# Patient Record
Sex: Female | Born: 1955 | Race: White | Hispanic: No | Marital: Single | Smoking: Never smoker
Health system: Southern US, Community
[De-identification: ages and names within clinical notes are randomized; demographics above are authoritative.]

## PROBLEM LIST (undated history)

## (undated) DIAGNOSIS — K219 Gastro-esophageal reflux disease without esophagitis: Secondary | ICD-10-CM

## (undated) HISTORY — PX: HERNIA REPAIR: SHX51

## (undated) HISTORY — DX: Gastro-esophageal reflux disease without esophagitis: K21.9

## (undated) HISTORY — PX: CHOLECYSTECTOMY: SHX55

## (undated) HISTORY — PX: COLONOSCOPY: SHX5424

---

## 1991-10-11 HISTORY — PX: ABDOMINAL HYSTERECTOMY: SHX81

## 1999-01-18 ENCOUNTER — Other Ambulatory Visit: Admission: RE | Admit: 1999-01-18 | Discharge: 1999-01-18 | Payer: Self-pay | Admitting: Obstetrics and Gynecology

## 2000-02-21 ENCOUNTER — Other Ambulatory Visit: Admission: RE | Admit: 2000-02-21 | Discharge: 2000-02-21 | Payer: Self-pay | Admitting: *Deleted

## 2001-02-21 ENCOUNTER — Other Ambulatory Visit: Admission: RE | Admit: 2001-02-21 | Discharge: 2001-02-21 | Payer: Self-pay | Admitting: *Deleted

## 2002-03-12 ENCOUNTER — Other Ambulatory Visit: Admission: RE | Admit: 2002-03-12 | Discharge: 2002-03-12 | Payer: Self-pay | Admitting: Obstetrics and Gynecology

## 2003-03-18 ENCOUNTER — Other Ambulatory Visit: Admission: RE | Admit: 2003-03-18 | Discharge: 2003-03-18 | Payer: Self-pay | Admitting: Obstetrics and Gynecology

## 2004-10-10 HISTORY — PX: KNEE ARTHROSCOPY: SHX127

## 2009-10-10 HISTORY — PX: TOTAL KNEE ARTHROPLASTY: SHX125

## 2010-02-26 ENCOUNTER — Inpatient Hospital Stay (HOSPITAL_COMMUNITY): Admission: RE | Admit: 2010-02-26 | Discharge: 2010-03-01 | Payer: Self-pay | Admitting: Specialist

## 2010-03-03 ENCOUNTER — Ambulatory Visit: Admission: RE | Admit: 2010-03-03 | Discharge: 2010-03-03 | Payer: Self-pay | Admitting: Specialist

## 2010-03-03 ENCOUNTER — Encounter (INDEPENDENT_AMBULATORY_CARE_PROVIDER_SITE_OTHER): Payer: Self-pay | Admitting: Specialist

## 2010-03-03 ENCOUNTER — Ambulatory Visit: Payer: Self-pay | Admitting: Vascular Surgery

## 2010-10-10 HISTORY — PX: CHOLECYSTECTOMY: SHX55

## 2010-10-10 HISTORY — PX: KNEE ARTHROSCOPY: SHX127

## 2010-12-27 LAB — BASIC METABOLIC PANEL
BUN: 6 mg/dL (ref 6–23)
BUN: 7 mg/dL (ref 6–23)
CO2: 24 mEq/L (ref 19–32)
Chloride: 104 mEq/L (ref 96–112)
Chloride: 110 mEq/L (ref 96–112)
Creatinine, Ser: 0.84 mg/dL (ref 0.4–1.2)
GFR calc Af Amer: >60 mL/min (ref >60)
GFR calc non Af Amer: >60 mL/min (ref >60)
Potassium: 3.8 mEq/L (ref 3.5–5.1)
Potassium: 4 mEq/L (ref 3.5–5.1)
Sodium: 135 mEq/L (ref 135–145)

## 2010-12-27 LAB — CBC
HCT: 26.5 % — ABNORMAL LOW (ref 36.0–46.0)
HCT: 28.5 % — ABNORMAL LOW (ref 36.0–46.0)
HCT: 31.8 % — ABNORMAL LOW (ref 36.0–46.0)
Hemoglobin: 10.6 g/dL — ABNORMAL LOW (ref 12.0–15.0)
MCHC: 32.1 g/dL (ref 30.0–36.0)
MCV: 87.4 fL (ref 78.0–100.0)
MCV: 87.5 fL (ref 78.0–100.0)
MCV: 88.8 fL (ref 78.0–100.0)
Platelets: 176 10*3/uL (ref 150–400)
Platelets: 184 10*3/uL (ref 150–400)
Platelets: 201 10*3/uL (ref 150–400)
RBC: 3.63 MIL/uL — ABNORMAL LOW (ref 3.87–5.11)
RDW: 16.1 % — ABNORMAL HIGH (ref 11.5–15.5)
WBC: 13.4 10*3/uL — ABNORMAL HIGH (ref 4.0–10.5)
WBC: 13.4 10*3/uL — ABNORMAL HIGH (ref 4.0–10.5)
WBC: 18.8 10*3/uL — ABNORMAL HIGH (ref 4.0–10.5)

## 2010-12-27 LAB — ABO/RH: ABO/RH(D): A NEG

## 2010-12-27 LAB — CROSSMATCH

## 2010-12-28 LAB — DIFFERENTIAL
Eosinophils Absolute: 0.3 10*3/uL (ref 0.0–0.7)
Eosinophils Relative: 4 % (ref 0–5)
Lymphs Abs: 1.8 10*3/uL (ref 0.7–4.0)
Monocytes Absolute: 0.6 10*3/uL (ref 0.1–1.0)
Monocytes Relative: 8 % (ref 3–12)
Neutrophils Relative %: 64 % (ref 43–77)

## 2010-12-28 LAB — URINALYSIS, ROUTINE W REFLEX MICROSCOPIC
Bilirubin Urine: NEGATIVE
Ketones, ur: NEGATIVE mg/dL
Nitrite: NEGATIVE
Specific Gravity, Urine: 1.009 (ref 1.005–1.030)
Urobilinogen, UA: 0.2 mg/dL (ref 0.0–1.0)

## 2010-12-28 LAB — COMPREHENSIVE METABOLIC PANEL
ALT: 19 U/L (ref 0–35)
AST: 25 U/L (ref 0–37)
Albumin: 3.7 g/dL (ref 3.5–5.2)
Calcium: 9.2 mg/dL (ref 8.4–10.5)
GFR calc Af Amer: >60 mL/min (ref >60)
Potassium: 4.1 mEq/L (ref 3.5–5.1)
Sodium: 139 mEq/L (ref 135–145)
Total Protein: 7 g/dL (ref 6.0–8.3)

## 2010-12-28 LAB — PROTIME-INR: INR: 1.02 (ref 0.00–1.49)

## 2010-12-28 LAB — CBC
MCHC: 32.7 g/dL (ref 30.0–36.0)
RDW: 15.7 % — ABNORMAL HIGH (ref 11.5–15.5)

## 2014-07-04 ENCOUNTER — Ambulatory Visit (INDEPENDENT_AMBULATORY_CARE_PROVIDER_SITE_OTHER): Payer: BC Managed Care – PPO | Admitting: Family Medicine

## 2014-07-04 ENCOUNTER — Encounter: Payer: Self-pay | Admitting: Family Medicine

## 2014-07-04 VITALS — BP 141/81 | HR 79 | Ht 64.75 in | Wt 192.0 lb

## 2014-07-04 DIAGNOSIS — M795 Residual foreign body in soft tissue: Secondary | ICD-10-CM | POA: Diagnosis not present

## 2014-07-04 DIAGNOSIS — K429 Umbilical hernia without obstruction or gangrene: Secondary | ICD-10-CM | POA: Diagnosis not present

## 2014-07-04 NOTE — Progress Notes (Signed)
CC: Amanda Walters is a 58 y.o. 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 Symptoms are described as stabbing, "13" on a 0-10 pain scale, and it comes and uncomfortable waves. Slightly improved when holding the abdomen. Nothing else seems to make better or worse. She had a CT scan done in March that revealed an umbilical hernia, she tells me that she was seen by a [facility] and they decided to watch and wait to see if pain got any worse. Patient believes that it is truly getting worse on a monthly basis. She had an attack this morning that was the worst she's ever experienced. The episode lasted a little over an hour.    Interestingly the CT scan she has showed a fragment of a needle in her left buttock and over the past few months she's noticed that she's having a sharp pain in that left buttock whenever sitting for long periods of time.   Review of Systems - General ROS: negative for - chills, fever, night sweats, weight gain or weight loss Ophthalmic ROS: negative for - decreased vision Psychological ROS: negative for - anxiety or depression ENT ROS: negative for - hearing change, nasal congestion, tinnitus or allergies Hematological and Lymphatic ROS: negative for - bleeding problems, bruising or swollen lymph nodes Breast ROS: negative Respiratory ROS: no cough, shortness of breath, or wheezing Cardiovascular ROS: no chest pain or dyspnea on exertion Gastrointestinal ROS: no change in bowel habits, or black or bloody stools Genito-Urinary ROS: negative for - genital discharge, genital ulcers, incontinence or abnormal bleeding from genitals Musculoskeletal ROS: negative for - joint pain or muscle pain other than that  described above Neurological ROS: negative for - headaches or memory loss Dermatological ROS: negative for lumps, mole changes, rash and skin lesion changes  History reviewed. No pertinent past medical history.  Past Surgical History  Procedure Laterality Date  . Abdominal hysterectomy  1993  . Knee arthroscopy  2006  . Total knee arthroplasty  2011  . Knee arthroscopy  2012   Family History  Problem Relation Age of Onset  . Heart attack      mother     History   Social History  . Marital Status: Single    Spouse Name: N/A    Number of Children: N/A  . Years of Education: N/A   Occupational History  . Not on file.   Social History Main Topics  . Smoking status: Never Smoker   . Smokeless tobacco: Not on file  . Alcohol Use: Not on file  . Drug Use: Not on file  . Sexual Activity: Not Currently   Other Topics Concern  . Not on file   Social History Narrative  . No narrative on file     Objective: BP 141/81  Pulse 79  Ht 5' 4.75" (1.645 m)  Wt 192 lb (87.091 kg)  BMI 32.18 kg/m2  General: Alert and Oriented, No Acute Distress HEENT: Pupils equal, round, reactive to light. Conjunctivae clear.   moist membranes pharynx unremarkable  Lungs:  clear and comfortable work of breathing  Cardiac: Regular rate and rhythm.  Abdomen: Normal bowel sounds,  soft with an approximately one and a half abdominal wall defect at the umbilicus with a hernia  that is easily reducible. No other palpable masses .no rebound guarding or rigidity  Extremities: No peripheral edema.  Strong peripheral pulses.  Mental Status: No depression, anxiety, nor agitation. Skin: Warm and dry.  Assessment & Plan: Amanda Walters was seen today for establish care and concerned about hernia.  Diagnoses and associated orders for this visit:  Umbilical hernia without obstruction and without gangrene - Ambulatory referral to General Surgery  Foreign body (FB) in soft tissue    Umbilical hernia: Given  the degree of her pain I think is quite reasonable to have her see Gen. surgery for consideration of hernia repair.  It sounds like the original surgery group did not get a good grasp of how much pain she was in. I've asked her to mention to them about the foreign body in her right buttock because their office may be able to help manage this as well.Signs and symptoms requring emergent/urgent reevaluation were discussed with the patient.   Return if symptoms worsen or fail to improve.

## 2014-07-21 ENCOUNTER — Telehealth: Payer: Self-pay | Admitting: Family Medicine

## 2014-07-21 DIAGNOSIS — K429 Umbilical hernia without obstruction or gangrene: Secondary | ICD-10-CM | POA: Insufficient documentation

## 2014-07-21 NOTE — Telephone Encounter (Signed)
Opened for care everywhere 

## 2014-08-29 ENCOUNTER — Ambulatory Visit (INDEPENDENT_AMBULATORY_CARE_PROVIDER_SITE_OTHER): Payer: BC Managed Care – PPO | Admitting: Family Medicine

## 2014-08-29 ENCOUNTER — Encounter: Payer: Self-pay | Admitting: Family Medicine

## 2014-08-29 VITALS — BP 135/84 | HR 73 | Wt 187.0 lb

## 2014-08-29 DIAGNOSIS — S134XXA Sprain of ligaments of cervical spine, initial encounter: Secondary | ICD-10-CM | POA: Diagnosis not present
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
## 2016-03-16 ENCOUNTER — Telehealth: Payer: Self-pay

## 2016-03-16 DIAGNOSIS — M79642 Pain in left hand: Principal | ICD-10-CM

## 2016-03-16 DIAGNOSIS — M79641 Pain in right hand: Secondary | ICD-10-CM

## 2016-03-16 NOTE — Telephone Encounter (Signed)
Pt.notified

## 2016-03-16 NOTE — Telephone Encounter (Signed)
Pt is requesting a referral to a rheumatologist.  She is c/o bilateral hand pain starting at the thumbs here recently traveling to the other fingers. If you agree referral is half way placed I'm not sure where to sent it. Please advise.

## 2016-03-16 NOTE — Telephone Encounter (Signed)
Referral sounds appropriate, order has been placed.

## 2016-07-07 ENCOUNTER — Encounter: Payer: Self-pay | Admitting: Osteopathic Medicine

## 2016-07-07 ENCOUNTER — Ambulatory Visit (INDEPENDENT_AMBULATORY_CARE_PROVIDER_SITE_OTHER): Payer: BC Managed Care – PPO | Admitting: Osteopathic Medicine

## 2016-07-07 VITALS — BP 124/52 | HR 76 | Temp 98.9°F | Ht 64.0 in | Wt 193.0 lb

## 2016-07-07 DIAGNOSIS — J069 Acute upper respiratory infection, unspecified: Secondary | ICD-10-CM | POA: Diagnosis not present

## 2016-07-07 DIAGNOSIS — J329 Chronic sinusitis, unspecified: Secondary | ICD-10-CM | POA: Diagnosis not present

## 2016-07-07 DIAGNOSIS — B9789 Other viral agents as the cause of diseases classified elsewhere: Principal | ICD-10-CM

## 2016-07-07 MED ORDER — AMOXICILLIN-POT CLAVULANATE 875-125 MG PO TABS: 1.0000 | ORAL_TABLET | Freq: Two times a day (BID) | ORAL | 0 refills | Status: DC

## 2016-07-07 MED ORDER — BENZONATATE 200 MG PO CAPS: 200.0000 mg | ORAL_CAPSULE | Freq: Three times a day (TID) | ORAL | 0 refills | Status: DC | PRN

## 2016-07-07 MED ORDER — IPRATROPIUM BROMIDE 0.03 % NA SOLN: 2.0000 | Freq: Three times a day (TID) | NASAL | 0 refills | Status: DC | PRN

## 2016-07-07 NOTE — Patient Instructions (Signed)
Most likely your symptoms are due to viral upper respiratory illness causing postnasal drip, sore throat, cough, and sinus congestion. The common cold or viral illness is not something that we can cure, but we can help control symptoms while your body fights the infection.   You've been given prescription for nasal spray to help with sinus drainage, plus a prescription for cough medicine. Other over-the-counter remedies which are typically helpful include taking all of the following together: Ibuprofen, Tylenol, decongestants such as Sudafed, antihistamine such as Benadryl. Use caution, many generics are sold in cold/flu formulations as combination, always ask a pharmacist if you are concerned about any medication interactions or duplications. Look for lozenges which contain menthol plus benzocaine, these will help numb the throat and prevent cough. Be sure you're staying well hydrated with plenty of water or warm tea with honey.   Most people feel better from a viral upper respiratory infection in 7-10 days, though cough symptoms can linger for several weeks. Please let us know if you're not getting better or if you get worse.

## 2016-07-07 NOTE — Progress Notes (Signed)
HPI: Amanda Walters is a 60 y.o. female  who presents to [place] today, 07/07/16,  for chief complaint of:  Chief Complaint  Patient presents with  . Establish Care    Switching from [place]. cough     . Quality: dry cough  . Duration: 6 days . Modifying factors: Mucinex, no oher OTC medication. Hard candies.  . Assoc signs/symptoms: sinus congestion, sore throat, ears pop, subjective fever few days ago     Past medical, surgical, social and family history reviewed: History reviewed. No pertinent past medical history. Past Surgical History:  Procedure Laterality Date  . ABDOMINAL HYSTERECTOMY  1993  . KNEE ARTHROSCOPY  2006  . KNEE ARTHROSCOPY  2012  . TOTAL KNEE ARTHROPLASTY  2011   Social History  Substance Use Topics  . Smoking status: Never Smoker  . Smokeless tobacco: Never Used  . Alcohol use Not on file   Family History  Problem Relation Age of Onset  . Heart attack      mother      Current medication list and allergy/intolerance information reviewed:   Current Outpatient Prescriptions  Medication Sig Dispense Refill  . Cholecalciferol (D3 ADULT PO) Take 1,000 mg by mouth.    . Cyanocobalamin (B-12 PO) Take by mouth.    . estradiol (ESTRACE) 1 MG tablet     . Multiple Vitamins-Minerals (MULTIVITAMIN PO) Take by mouth.    Marland Kitchen omeprazole (PRILOSEC) 20 MG capsule Take 20 mg by mouth daily.     No current facility-administered medications for this visit.    Allergies  Allergen Reactions  . Morphine And Related Itching  . Cephalexin Other (See Comments)    "funny feeling"  . Sulfa Antibiotics Hives      Review of Systems:  Constitutional:  +fever, no chills, +recent illness, No unintentional weight changes.   HEENT: No  headache, no vision change, no hearing change, +sore throat, +sinus pressure  Cardiac: No  chest pain  Respiratory:  No  shortness of breath. +Cough  Gastrointestinal: No  abdominal pain, No   nausea   Musculoskeletal: No new myalgia/arthralgia  Skin: No  Rash,   Neurologic: No  weakness, No  dizziness,   Psychiatric: No  concerns with depression, No  concerns with anxiety, No sleep problems, No mood problems  Exam:  BP (!) 124/52   Pulse 76   Temp 98.9 F (37.2 C) (Oral)   Ht 5\' 4"  (1.626 m)   Wt 193 lb (87.5 kg)   BMI 33.13 kg/m   Constitutional: VS see above. General Appearance: alert, well-developed, well-nourished, NAD  Eyes: Normal lids and conjunctive, non-icteric sclera  Ears, Nose, Mouth, Throat: MMM, Normal external inspection ears/nares/mouth/lips/gums. TM normal bilaterally. Pharynx/tonsils no erythema, no exudate. Nasal mucosa normal.   Neck: No masses, trachea midline. No thyroid enlargement. No tenderness/mass appreciated. No lymphadenopathy  Respiratory: Normal respiratory effort. no wheeze, no rhonchi, no rales  Cardiovascular: S1/S2 normal, no murmur, no rub/gallop auscultated. RRR.   Psychiatric: Normal judgment/insight. Normal mood and affect. Oriented x3.    No results found for this or any previous visit (from the past 72 hour(s)).  No results found.   ASSESSMENT/PLAN:   Viral URI with cough - Plan: ipratropium (ATROVENT) 0.03 % nasal spray, benzonatate (TESSALON) 200 MG capsule  Rhinosinusitis - Plan: amoxicillin-clavulanate (AUGMENTIN) 875-125 MG tablet instructions to fill antibiotic if no improvement in addition days after initial onset of symptoms.   Patient Instructions  Most likely your symptoms  are due to viral upper respiratory illness causing postnasal drip, sore throat, cough, and sinus congestion. The common cold or viral illness is not something that we can cure, but we can help control symptoms while your body fights the infection.   You've been given prescription for nasal spray to help with sinus drainage, plus a prescription for cough medicine. Other over-the-counter remedies which are typically helpful include taking  all of the following together: Ibuprofen, Tylenol, decongestants such as Sudafed, antihistamine such as Benadryl. Use caution, many generics are sold in cold/flu formulations as combination, always ask a pharmacist if you are concerned about any medication interactions or duplications. Look for lozenges which contain menthol plus benzocaine, these will help numb the throat and prevent cough. Be sure you're staying well hydrated with plenty of water or warm tea with honey.   Most people feel better from a viral upper respiratory infection in 7-10 days, though cough symptoms can linger for several weeks. Please let us know if you're not getting better or if you get worse.     Visit summary with medication list and pertinent instructions was printed for patient to review. All questions at time of visit were answered - patient instructed to contact office with any additional concerns. ER/RTC precautions were reviewed with the patient. Follow-up plan: Return if symptoms worsen or fail to improve.

## 2016-07-19 ENCOUNTER — Encounter: Payer: Self-pay | Admitting: Sports Medicine

## 2016-07-19 ENCOUNTER — Ambulatory Visit (INDEPENDENT_AMBULATORY_CARE_PROVIDER_SITE_OTHER): Payer: BC Managed Care – PPO | Admitting: Sports Medicine

## 2016-07-19 DIAGNOSIS — B349 Viral infection, unspecified: Secondary | ICD-10-CM

## 2016-07-19 DIAGNOSIS — B9789 Other viral agents as the cause of diseases classified elsewhere: Secondary | ICD-10-CM | POA: Diagnosis not present

## 2016-07-19 DIAGNOSIS — J069 Acute upper respiratory infection, unspecified: Secondary | ICD-10-CM | POA: Diagnosis not present

## 2016-07-19 MED ORDER — MELOXICAM 15 MG PO TABS: ORAL_TABLET | ORAL | 3 refills | Status: DC

## 2016-07-19 MED ORDER — BENZONATATE 200 MG PO CAPS: 200.0000 mg | ORAL_CAPSULE | Freq: Three times a day (TID) | ORAL | 0 refills | Status: DC | PRN

## 2016-07-19 MED ORDER — DIPHENOXYLATE-ATROPINE 2.5-0.025 MG PO TABS: ORAL_TABLET | ORAL | 3 refills | Status: DC

## 2016-07-19 NOTE — Assessment & Plan Note (Signed)
Has already finished a course of Augmentin for sinus-type symptoms, also having some diarrhea, malaise. Suspect persistence of her viral type illness, exam is benign. Worst symptoms are diarrhea and malaise so we will add meloxicam and Lomotil, out of work for one week.

## 2016-07-19 NOTE — Progress Notes (Signed)
  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 These have improved but she continues to simply have a low-grade cough, diarrhea several times per day without blood, and malaise. Symptoms are mild, improving.  Past medical history:  Negative.  See flowsheet/record as well for more information.  Surgical history: Negative.  See flowsheet/record as well for more information.  Family history: Negative.  See flowsheet/record as well for more information.  Social history: Negative.  See flowsheet/record as well for more information.  Allergies, and medications have been entered into the medical record, reviewed, and no changes needed.   Review of Systems: No fevers, chills, night sweats, weight loss, chest pain, or shortness of breath.   Objective:    General: Well Developed, well nourished, and in no acute distress.  Neuro: Alert and oriented x3, extra-ocular muscles intact, sensation grossly intact.  HEENT: Normocephalic, atraumatic, pupils equal round reactive to light, neck supple, no masses, no lymphadenopathy, thyroid nonpalpable. Oropharynx, nasopharynx, ear canals unremarkable. Skin: Warm and dry, no rashes. Cardiac: Regular rate and rhythm, no murmurs rubs or gallops, no lower extremity edema.  Respiratory: Clear to auscultation bilaterally. Not using accessory muscles, speaking in full sentences. Abdomen: Soft, nontender, nondistended, normal bowel sounds, no palpable masses, guarding, rigidity, or rebound tenderness.  Impression and Recommendations:    Viral syndrome Has already finished a course of Augmentin for sinus-type symptoms, also having some diarrhea, malaise. Suspect persistence of her viral type illness, exam is benign. Worst symptoms are diarrhea and malaise so we will add meloxicam  and Lomotil, out of work for one week.  I spent 25 minutes with this patient, greater than 50% was face-to-face time counseling regarding the above diagnoses

## 2016-07-25 ENCOUNTER — Telehealth: Payer: Self-pay

## 2016-07-25 DIAGNOSIS — R197 Diarrhea, unspecified: Secondary | ICD-10-CM

## 2016-07-25 NOTE — Telephone Encounter (Signed)
Orders placed for stool culture, C. difficile, ova and parasites. Letter in my box.

## 2016-07-25 NOTE — Telephone Encounter (Signed)
Amanda Walters still has diarrhea. She has diarrhea a least 3 times daily. She would like to have a stool culture and C-diff checked.   She also would like to have a work note for another day out of work.

## 2016-07-25 NOTE — Telephone Encounter (Signed)
Patient has picked up orders and letter.

## 2016-07-26 ENCOUNTER — Telehealth: Payer: Self-pay

## 2016-07-26 NOTE — Telephone Encounter (Signed)
Patient advised of recommendations.  

## 2016-07-26 NOTE — Telephone Encounter (Signed)
Amanda Walters called and states she still has diarrhea. She is concerned she is contagious and is worried about returning to school. Please advise. She did drop off the stool specimen.

## 2016-07-26 NOTE — Telephone Encounter (Signed)
Symptoms can persist for 2 weeks or longer, we will need to certainly await results of the stool cultures. Penicillin-containing antibiotics can also create some degree of prolonged diarrhea.

## 2016-07-27 LAB — C. DIFFICILE GDH AND TOXIN A/B
C. difficile GDH: NOT DETECTED
C. difficile Toxin A/B: NOT DETECTED

## 2016-07-28 ENCOUNTER — Encounter: Payer: Self-pay | Admitting: Sports Medicine

## 2016-07-30 LAB — STOOL CULTURE

## 2016-08-15 ENCOUNTER — Other Ambulatory Visit: Payer: Self-pay | Admitting: Osteopathic Medicine

## 2016-08-15 DIAGNOSIS — B9789 Other viral agents as the cause of diseases classified elsewhere: Principal | ICD-10-CM

## 2016-08-15 DIAGNOSIS — J069 Acute upper respiratory infection, unspecified: Secondary | ICD-10-CM

## 2016-11-04 ENCOUNTER — Ambulatory Visit (INDEPENDENT_AMBULATORY_CARE_PROVIDER_SITE_OTHER): Payer: BC Managed Care – PPO | Admitting: Physician Assistant

## 2016-11-04 VITALS — BP 133/78 | HR 71 | Temp 98.3°F | Wt 197.0 lb

## 2016-11-04 DIAGNOSIS — J069 Acute upper respiratory infection, unspecified: Secondary | ICD-10-CM | POA: Diagnosis not present

## 2016-11-04 MED ORDER — AMOXICILLIN-POT CLAVULANATE 875-125 MG PO TABS: 1.0000 | ORAL_TABLET | Freq: Two times a day (BID) | ORAL | 0 refills | Status: AC

## 2016-11-04 MED ORDER — BENZONATATE 200 MG PO CAPS: 200.0000 mg | ORAL_CAPSULE | Freq: Three times a day (TID) | ORAL | 0 refills | Status: DC | PRN

## 2016-11-04 NOTE — Patient Instructions (Addendum)
Take antibiotic as prescribed Continue Mucinex every 4 hours scheduled with at least 8oz of water Benzonatate 200mg  every 6 hours for cough Ipratropium nasal spray four times a day (1 spray each nostril - blow your nose before you spray) The normal course of illness is 10-14 days. It is possible to have a cough for a couple of weeks after all of your other symptoms have resolved. Please return if you have a cough for 4 weeks or more, fever or worsening symptoms.   Upper Respiratory Infection, Adult Most upper respiratory infections (URIs) are a viral infection of the air passages leading to the lungs. A URI affects the nose, throat, and upper air passages. The most common type of URI is nasopharyngitis and is typically referred to as "the common cold." URIs run their course and usually go away on their own. Most of the time, a URI does not require medical attention, but sometimes a bacterial infection in the upper airways can follow a viral infection. This is called a secondary infection. Sinus and middle ear infections are common types of secondary upper respiratory infections. Bacterial pneumonia can also complicate a URI. A URI can worsen asthma and chronic obstructive pulmonary disease (COPD). Sometimes, these complications can require emergency medical care and may be life threatening. What are the causes? Almost all URIs are caused by viruses. A virus is a type of germ and can spread from one person to another. What increases the risk? You may be at risk for a URI if:  You smoke.  You have chronic heart or lung disease.  You have a weakened defense (immune) system.  You are very young or very old.  You have nasal allergies or asthma.  You work in crowded or poorly ventilated areas.  You work in health care facilities or schools. What are the signs or symptoms? Symptoms typically develop 2-3 days after you come in contact with a cold virus. Most viral URIs last 7-10 days. However,  viral URIs from the influenza virus (flu virus) can last 14-18 days and are typically more severe. Symptoms may include:  Runny or stuffy (congested) nose.  Sneezing.  Cough.  Sore throat.  Headache.  Fatigue.  Fever.  Loss of appetite.  Pain in your forehead, behind your eyes, and over your cheekbones (sinus pain).  Muscle aches. How is this diagnosed? Your health care provider may diagnose a URI by:  Physical exam.  Tests to check that your symptoms are not due to another condition such as:  Strep throat.  Sinusitis.  Pneumonia.  Asthma. How is this treated? A URI goes away on its own with time. It cannot be cured with medicines, but medicines may be prescribed or recommended to relieve symptoms. Medicines may help:  Reduce your fever.  Reduce your cough.  Relieve nasal congestion. Follow these instructions at home:  Take medicines only as directed by your health care provider.  Gargle warm saltwater or take cough drops to comfort your throat as directed by your health care provider.  Use a warm mist humidifier or inhale steam from a shower to increase air moisture. This may make it easier to breathe.  Drink enough fluid to keep your urine clear or pale yellow.  Eat soups and other clear broths and maintain good nutrition.  Rest as needed.  Return to work when your temperature has returned to normal or as your health care provider advises. You may need to stay home longer to avoid infecting others. You can  also use a face mask and careful hand washing to prevent spread of the virus.  Increase the usage of your inhaler if you have asthma.  Do not use any tobacco products, including cigarettes, chewing tobacco, or electronic cigarettes. If you need help quitting, ask your health care provider. How is this prevented? The best way to protect yourself from getting a cold is to practice good hygiene.  Avoid oral or hand contact with people with cold  symptoms.  Wash your hands often if contact occurs. There is no clear evidence that vitamin C, vitamin E, echinacea, or exercise reduces the chance of developing a cold. However, it is always recommended to get plenty of rest, exercise, and practice good nutrition. Contact a health care provider if:  You are getting worse rather than better.  Your symptoms are not controlled by medicine.  You have chills.  You have worsening shortness of breath.  You have brown or red mucus.  You have yellow or brown nasal discharge.  You have pain in your face, especially when you bend forward.  You have a fever.  You have swollen neck glands.  You have pain while swallowing.  You have white areas in the back of your throat. Get help right away if:  You have severe or persistent:  Headache.  Ear pain.  Sinus pain.  Chest pain.  You have chronic lung disease and any of the following:  Wheezing.  Prolonged cough.  Coughing up blood.  A change in your usual mucus.  You have a stiff neck.  You have changes in your:  Vision.  Hearing.  Thinking.  Mood. This information is not intended to replace advice given to you by your health care provider. Make sure you discuss any questions you have with your health care provider. Document Released: 03/22/2001 Document Revised: 05/29/2016 Document Reviewed: 01/01/2014 Elsevier Interactive Patient Education  2017 [employer].

## 2016-11-04 NOTE — Progress Notes (Signed)
HPI:                                                                ALFREDO VOLLRATH is a 61 y.o. female who presents to [facility] [place] today for a sick visit   Cough  This is a new problem. The current episode started in the past 7 days. The problem has been unchanged. The problem occurs hourly. The cough is non-productive. Associated symptoms include ear pain (right), headaches, postnasal drip, rhinorrhea and shortness of breath. Pertinent negatives include no chest pain, chills, fever, myalgias, sore throat or wheezing. Nothing aggravates the symptoms. Treatments tried: mucinex, vitamin c, echinacea, ibuprofen. There is no history of asthma, COPD or environmental allergies.    Health Maintenance Health Maintenance  Topic Date Due  . Hepatitis C Screening  1956/07/31  . HIV Screening  03/03/1970  . ZOSTAVAX  03/04/2015  . INFLUENZA VACCINE  06/10/2017 (Originally 05/10/2016)  . MAMMOGRAM  10/10/2017 (Originally 03/03/2005)  . PAP SMEAR  10/10/2017 (Originally 03/03/1976)  . COLONOSCOPY  10/10/2017 (Originally 03/03/2005)  . TETANUS/TDAP  06/11/2026     No past medical history on file. Past Surgical History:  Procedure Laterality Date  . ABDOMINAL HYSTERECTOMY  1993  . KNEE ARTHROSCOPY  2006  . KNEE ARTHROSCOPY  2012  . TOTAL KNEE ARTHROPLASTY  2011   Social History  Substance Use Topics  . Smoking status: Never Smoker  . Smokeless tobacco: Never Used  . Alcohol use Not on file   family history is not on file.  ROS: negative except as noted in the HPI  Medications: Current Outpatient Prescriptions  Medication Sig Dispense Refill  . Cholecalciferol (D3 ADULT PO) Take 1,000 mg by mouth.    . Cyanocobalamin (B-12 PO) Take by mouth.    . diphenoxylate-atropine (LOMOTIL) 2.5-0.025 MG tablet One to 2 tablets by mouth 4 times a day as needed for diarrhea. 30 tablet 3  . estradiol (ESTRACE) 1 MG tablet     . ipratropium (ATROVENT)  0.03 % nasal spray Place 2 sprays into both nostrils 3 (three) times daily as needed for rhinitis. 30 mL 0  . Loratadine 10 MG CAPS Take by mouth.    . meloxicam (MOBIC) 15 MG tablet One tab PO qAM with breakfast for 2 weeks, then daily prn pain. 30 tablet 3  . Misc Natural Products (OSTEO BI-FLEX JOINT SHIELD PO) Take by mouth.    . Multiple Vitamins-Minerals (MULTIVITAMIN PO) Take by mouth.    Marland Kitchen omeprazole (PRILOSEC) 20 MG capsule Take 20 mg by mouth daily.     No current facility-administered medications for this visit.    Allergies  Allergen Reactions  . Morphine And Related Itching  . Cephalexin Other (See Comments)    "funny feeling"  . Sulfa Antibiotics Hives       Objective:  BP 133/78   Pulse 71   Temp 98.3 F (36.8 C) (Oral)   Wt 197 lb (89.4 kg)   BMI 33.81 kg/m  Gen: well-groomed, cooperative, not ill-appearing, no distress HEENT: normal conjunctiva, TM's clear, nasal mucosa edematous, oropharynx clear, moist mucus membranes, no frontal or maxillary sinus tenderness Pulm: Normal work of breathing, normal phonation, clear to auscultation bilaterally, no wheezes, rales or rhonchi  CV: Normal rate, regular rhythm, s1 and s2 distinct, no murmurs, clicks or rubs  GI: soft, nondistended, nontender Neuro: alert and oriented x 3, EOM's intact Lymph: no cervical or tonsillar adenopathy Skin: warm and dry, no rashes or lesions on exposed skin, no cyanosis   No results found for this or any previous visit (from the past 72 hour(s)). No results found.    Assessment and Plan: 61 y.o. female with   Acute upper respiratory infection - likely viral, but patient is requesting antibiotics. Explained that if she does not feel better with the antibiotic it is because this is a viral illness and it will run its course in 10-14 days - symptomatic management: Mucinex, Tessalon, and nasal spray - amoxicillin-clavulanate (AUGMENTIN) 875-125 MG tablet; Take 1 tablet by mouth 2 (two)  times daily.  Dispense: 20 tablet; Refill: 0 - benzonatate (TESSALON) 200 MG capsule; Take 1 capsule (200 mg total) by mouth 3 (three) times daily as needed for cough.  Dispense: 45 capsule; Refill: 0   No orders of the defined types were placed in this encounter.   Patient education and anticipatory guidance given Patient agrees with treatment plan Follow-up as needed if symptoms worsen or fail to improve  Darlyne Russian PA-C

## 2017-07-11 ENCOUNTER — Other Ambulatory Visit: Payer: Self-pay | Admitting: Osteopathic Medicine

## 2017-07-11 ENCOUNTER — Ambulatory Visit (INDEPENDENT_AMBULATORY_CARE_PROVIDER_SITE_OTHER): Payer: BC Managed Care – PPO | Admitting: Osteopathic Medicine

## 2017-07-11 VITALS — BP 128/57 | HR 73

## 2017-07-11 DIAGNOSIS — Z23 Encounter for immunization: Secondary | ICD-10-CM

## 2017-07-11 DIAGNOSIS — Z1159 Encounter for screening for other viral diseases: Secondary | ICD-10-CM

## 2017-07-11 DIAGNOSIS — Z114 Encounter for screening for human immunodeficiency virus [HIV]: Secondary | ICD-10-CM

## 2017-07-11 NOTE — Progress Notes (Signed)
Patient came into clinic today for flu and shingles vaccination. Patient was given a flu shot questionnaire prior to administration of immunization. All questions were answered no. Patient tolerated injection of flu immunization in rightdeltoid well, with no immediate complications.  Patient tolerated injection of shingrix immunization in left deltoid well, with no immediate complications.Advised to contact our office with any questions/concerns, and to return for second Shingrix vaccine in 2-6 months. Pt also requested to get her labs completed for Hep C screen, states her brother just had this done. Refused HIV screen.

## 2017-07-11 NOTE — Progress Notes (Signed)
Labs ordered per HM.

## 2017-07-12 LAB — HEPATITIS C ANTIBODY
HEP C AB: NONREACTIVE
SIGNAL TO CUT-OFF: 0.01 (ref <1.00)

## 2017-09-14 ENCOUNTER — Ambulatory Visit (INDEPENDENT_AMBULATORY_CARE_PROVIDER_SITE_OTHER): Payer: BC Managed Care – PPO | Admitting: Osteopathic Medicine

## 2017-09-14 VITALS — BP 123/59 | HR 75 | Temp 98.5°F

## 2017-09-14 DIAGNOSIS — Z23 Encounter for immunization: Secondary | ICD-10-CM

## 2017-09-14 NOTE — Progress Notes (Signed)
Pt came into clinic today for second Shingrix vaccine. Pt reports no negative side effects from first injection. Went over possible side effects. Pt tolerated injection in right deltoid well, no immediate complications. Pt advised to contact clinic with any questions or concerns. Verbalized understanding.

## 2017-11-06 ENCOUNTER — Ambulatory Visit (INDEPENDENT_AMBULATORY_CARE_PROVIDER_SITE_OTHER): Payer: BC Managed Care – PPO | Admitting: Osteopathic Medicine

## 2017-11-06 ENCOUNTER — Encounter: Payer: Self-pay | Admitting: Osteopathic Medicine

## 2017-11-06 ENCOUNTER — Other Ambulatory Visit: Payer: Self-pay | Admitting: Osteopathic Medicine

## 2017-11-06 VITALS — BP 132/79 | HR 80 | Temp 98.1°F | Wt 198.0 lb

## 2017-11-06 DIAGNOSIS — R109 Unspecified abdominal pain: Secondary | ICD-10-CM

## 2017-11-06 DIAGNOSIS — Z1211 Encounter for screening for malignant neoplasm of colon: Secondary | ICD-10-CM | POA: Diagnosis not present

## 2017-11-06 LAB — POCT URINALYSIS DIPSTICK
Bilirubin, UA: NEGATIVE
Blood, UA: NEGATIVE
GLUCOSE UA: NEGATIVE
Ketones, UA: NEGATIVE
LEUKOCYTES UA: NEGATIVE
NITRITE UA: NEGATIVE
PROTEIN UA: NEGATIVE
SPEC GRAV UA: 1.02 (ref 1.010–1.025)
Urobilinogen, UA: 0.2 E.U./dL
pH, UA: 6 (ref 5.0–8.0)

## 2017-11-06 NOTE — Patient Instructions (Signed)
Plan:  I think this is scar tissue from previous surgery   Labs today  Try increase fiber/water   If pain still bothering you, or if change/worse, will get CT scan and go from there

## 2017-11-06 NOTE — Progress Notes (Signed)
HPI: Amanda Walters is a 62 y.o. female who  has no past medical history on file.  she presents to [facility] today, 11/06/17,  for chief complaint of: Abdominal pain   . Context: Noted in location of previous ventral hernia repair. Status post cholecystectomy as well. Only other abdominal surgeries hysterectomy . Location: Adjacent to the umbilicus on the right . Quality: Cramping type pain, occasionally dull. No stabbing . Duration: About a week  . Timing: comes and goes, doesn't seem to be tied to any activity in particular, including meals or bowel movements. Doesn't seem to correspond to any particular time of day. At most it has lasted an hour when it comes on . Modifying factors: Nothing really makes it better or worse . Assoc signs/symptoms: No nausea/vomiting, no diarrhea/cuts patient, no blood in the stool.   Past medical, surgical, social and family history reviewed:  Patient Active Problem List   Diagnosis Date Noted  . Umbilical hernia 82/95/6213  . Foreign body (FB) in soft tissue 07/04/2014    Past Surgical History:  Procedure Laterality Date  . ABDOMINAL HYSTERECTOMY  1993  . KNEE ARTHROSCOPY  2006  . KNEE ARTHROSCOPY  2012  . TOTAL KNEE ARTHROPLASTY  2011    Social History   Tobacco Use  . Smoking status: Never Smoker  . Smokeless tobacco: Never Used  Substance Use Topics  . Alcohol use: Not on file    Family History  Problem Relation Age of Onset  . Heart attack Unknown        mother      Current medication list and allergy/intolerance information reviewed:    Current Outpatient Medications  Medication Sig Dispense Refill  . Cholecalciferol (D3 ADULT PO) Take 1,000 mg by mouth.    . Cyanocobalamin (B-12 PO) Take by mouth.    . estradiol (ESTRACE) 1 MG tablet     . Loratadine 10 MG CAPS Take by mouth.    . Misc Natural Products (OSTEO BI-FLEX JOINT SHIELD PO) Take by mouth.    . Multiple Vitamins-Minerals  (MULTIVITAMIN PO) Take by mouth.    Marland Kitchen omeprazole (PRILOSEC) 20 MG capsule Take 20 mg by mouth daily.    . diphenoxylate-atropine (LOMOTIL) 2.5-0.025 MG tablet One to 2 tablets by mouth 4 times a day as needed for diarrhea. (Patient not taking: Reported on 11/06/2017) 30 tablet 3  . ipratropium (ATROVENT) 0.03 % nasal spray Place 2 sprays into both nostrils 3 (three) times daily as needed for rhinitis. (Patient not taking: Reported on 11/06/2017) 30 mL 0  . meloxicam (MOBIC) 15 MG tablet One tab PO qAM with breakfast for 2 weeks, then daily prn pain. (Patient not taking: Reported on 11/06/2017) 30 tablet 3   No current facility-administered medications for this visit.     Allergies  Allergen Reactions  . Morphine And Related Itching  . Sulfa Antibiotics Hives  . Cephalexin Other (See Comments)    "funny feeling"      Review of Systems:  Constitutional:  No  fever, no chills, No recent illness, No unintentional weight changes. No significant fatigue.   HEENT: No  headache, no vision change  Cardiac: No  chest pain, No  pressure, No palpitations  Respiratory:  No  shortness of breath. No  Cough  Gastrointestinal: +abdominal pain, No  nausea, No  vomiting,  No  blood in stool, No  diarrhea, No  constipation   Skin: No  Rash  Genitourinary: No  incontinence,  No  abnormal genital bleeding, No abnormal genital discharge  Hem/Onc: No  easy bruising/bleeding  Neurologic: No  weakness, No  dizziness,  Exam:  BP 132/79   Pulse 80   Temp 98.1 F (36.7 C) (Oral)   Wt 198 lb (89.8 kg)   BMI 33.99 kg/m   Constitutional: VS see above. General Appearance: alert, well-developed, well-nourished, NAD  Eyes: Normal lids and conjunctive, non-icteric sclera  Ears, Nose, Mouth, Throat: MMM, Normal external inspection ears/nares/mouth/lips/gums.   Neck: No masses, trachea midline. No thyroid enlargement. No tenderness/mass appreciated. No lymphadenopathy  Respiratory: Normal respiratory  effort. no wheeze, no rhonchi, no rales  Cardiovascular: S1/S2 normal, no murmur, no rub/gallop auscultated. RRR. No lower extremity edema.  Gastrointestinal: Tenderness to palpation right side adjacent to the umbilicus, no right upper quadrant or right lower quadrant to concern for hepatic or appendix issue, no masses. No hepatomegaly, no splenomegaly. No hernia appreciated. Bowel sounds normal. Rectal exam deferred.   Musculoskeletal: Gait normal. No clubbing/cyanosis of digits.   Neurological: Normal balance/coordination. No tremor.   Skin: warm, dry, intact. No rash/ulcer. No concerning nevi or subq nodules on limited exam.    Psychiatric: Normal judgment/insight. Normal mood and affect. Oriented x3.    Results for orders placed or performed in visit on 11/06/17 (from the past 72 hour(s))  POCT Urinalysis Dipstick     Status: None   Collection Time: 11/06/17 11:11 AM  Result Value Ref Range   Color, UA YELLOW    Clarity, UA CLEAR    Glucose, UA NEGATIVE    Bilirubin, UA NEGATIVE    Ketones, UA NEGATIVE    Spec Grav, UA 1.020 1.010 - 1.025   Blood, UA NEGATIVE    pH, UA 6.0 5.0 - 8.0   Protein, UA NEGATIVE    Urobilinogen, UA 0.2 0.2 or 1.0 E.U./dL   Nitrite, UA NEGATIVE    Leukocytes, UA Negative Negative   Appearance     Odor        ASSESSMENT/PLAN: Description consistent with some cramping possibly due to previous surgical scar tissue. Advised that since she is feeling a little bit better today, this may go away on its own and recur intermittently. In the absence of other concerning symptoms, I'm less worried about something like obstruction, perforation, mass, infection. Of course if something gets worse/changes, will pursue imaging immediately, or symptoms persist. After discussion of risks versus benefits of CT scan now, patient okay with watchful waiting and get routine labs for now. She had a normal colonoscopy about 11 years ago, will update colon cancer screening  with cologuard.  Abdominal pain, unspecified abdominal location - Plan: POCT Urinalysis Dipstick, CBC with Differential/Platelet, COMPLETE METABOLIC PANEL WITH GFR, TSH, Lipase    Patient Instructions  Plan:  I think this is scar tissue from previous surgery   Labs today  Try increase fiber/water   If pain still bothering you, or if change/worse, will get CT scan and go from there     Visit summary with medication list and pertinent instructions was printed for patient to review. All questions at time of visit were answered - patient instructed to contact office with any additional concerns. ER/RTC precautions were reviewed with the patient.   Follow-up plan: Return if symptoms worsen or fail to improve.  Note: Total time spent 25 minutes, greater than 50% of the visit was spent face-to-face counseling and coordinating care for the following: The primary encounter diagnosis was Abdominal pain, unspecified abdominal location. A diagnosis  of Colon cancer screening was also pertinent to this visit.Marland Kitchen  Please note: voice recognition software was used to produce this document, and typos may escape review. Please contact Dr. Sheppard Coil for any needed clarifications.

## 2017-11-07 LAB — CBC WITH DIFFERENTIAL/PLATELET
BASOS ABS: 51 {cells}/uL (ref 0–200)
Basophils Relative: 0.7 %
EOS PCT: 4.5 %
Eosinophils Absolute: 329 cells/uL (ref 15–500)
HEMATOCRIT: 40.6 % (ref 35.0–45.0)
Hemoglobin: 13.4 g/dL (ref 11.7–15.5)
LYMPHS ABS: 1956 {cells}/uL (ref 850–3900)
MCH: 27.9 pg (ref 27.0–33.0)
MCHC: 33 g/dL (ref 32.0–36.0)
MCV: 84.6 fL (ref 80.0–100.0)
MONOS PCT: 5.7 %
MPV: 10.3 fL (ref 7.5–12.5)
NEUTROS PCT: 62.3 %
Neutro Abs: 4548 cells/uL (ref 1500–7800)
PLATELETS: 260 10*3/uL (ref 140–400)
RBC: 4.8 10*6/uL (ref 3.80–5.10)
RDW: 12.5 % (ref 11.0–15.0)
TOTAL LYMPHOCYTE: 26.8 %
WBC mixed population: 416 cells/uL (ref 200–950)
WBC: 7.3 10*3/uL (ref 3.8–10.8)

## 2017-11-07 LAB — COMPLETE METABOLIC PANEL WITH GFR
AG Ratio: 1.5 (calc) (ref 1.0–2.5)
ALKALINE PHOSPHATASE (APISO): 65 U/L (ref 33–130)
ALT: 14 U/L (ref 6–29)
AST: 20 U/L (ref 10–35)
Albumin: 4.2 g/dL (ref 3.6–5.1)
BUN/Creatinine Ratio: 13 (calc) (ref 6–22)
BUN: 14 mg/dL (ref 7–25)
CALCIUM: 9.5 mg/dL (ref 8.6–10.4)
CO2: 25 mmol/L (ref 20–32)
CREATININE: 1.12 mg/dL — AB (ref 0.50–0.99)
Chloride: 104 mmol/L (ref 98–110)
GFR, EST NON AFRICAN AMERICAN: 53 mL/min/{1.73_m2} — AB (ref >=60)
GFR, Est African American: 61 mL/min/{1.73_m2} (ref >=60)
GLOBULIN: 2.8 g/dL (ref 1.9–3.7)
Glucose, Bld: 131 mg/dL — ABNORMAL HIGH (ref 65–99)
POTASSIUM: 4.1 mmol/L (ref 3.5–5.3)
SODIUM: 138 mmol/L (ref 135–146)
Total Bilirubin: 0.3 mg/dL (ref 0.2–1.2)
Total Protein: 7 g/dL (ref 6.1–8.1)

## 2017-11-07 LAB — TSH: TSH: 4.66 m[IU]/L — AB (ref 0.40–4.50)

## 2017-11-07 LAB — LIPASE: Lipase: 39 U/L (ref 7–60)

## 2017-11-15 ENCOUNTER — Encounter: Payer: Self-pay | Admitting: Osteopathic Medicine

## 2017-11-15 LAB — COLOGUARD

## 2017-11-28 ENCOUNTER — Telehealth: Payer: Self-pay | Admitting: Osteopathic Medicine

## 2017-11-28 NOTE — Telephone Encounter (Signed)
Please call patient: I have received results from cologuard, testing was negative, plan to repeat in 3 years 

## 2017-11-29 NOTE — Telephone Encounter (Signed)
Pt advised. Verbalized understanding. No further questions.  

## 2018-03-06 DIAGNOSIS — M189 Osteoarthritis of first carpometacarpal joint, unspecified: Secondary | ICD-10-CM | POA: Insufficient documentation

## 2018-07-16 ENCOUNTER — Ambulatory Visit (INDEPENDENT_AMBULATORY_CARE_PROVIDER_SITE_OTHER): Payer: BC Managed Care – PPO | Admitting: Osteopathic Medicine

## 2018-07-16 ENCOUNTER — Encounter: Payer: Self-pay | Admitting: Osteopathic Medicine

## 2018-07-16 VITALS — BP 124/71 | HR 77 | Temp 98.2°F | Wt 199.1 lb

## 2018-07-16 DIAGNOSIS — Z23 Encounter for immunization: Secondary | ICD-10-CM

## 2018-07-16 DIAGNOSIS — B356 Tinea cruris: Secondary | ICD-10-CM | POA: Diagnosis not present

## 2018-07-16 MED ORDER — CLOTRIMAZOLE 1 % EX CREA: 1.0000 "application " | TOPICAL_CREAM | Freq: Two times a day (BID) | CUTANEOUS | 1 refills | Status: DC

## 2018-07-16 MED ORDER — FLUCONAZOLE 150 MG PO TABS: 150.0000 mg | ORAL_TABLET | Freq: Once | ORAL | 1 refills | Status: AC

## 2018-07-16 MED ORDER — CLOTRIMAZOLE POWD: 1.0000 "application " | Freq: Two times a day (BID) | 1 refills | Status: DC

## 2018-07-16 NOTE — Progress Notes (Signed)
HPI: Amanda Walters is a 62 y.o. female who  has no past medical history on file.  she presents to [facility] today, 07/16/18,  for chief complaint of:  Rash - new problem  . Location: Groin/leg area, worse in the skin folds. . Quality/Timing: Itching, comes and goes. . Duration: For about 1 month . Modifying factors: Has tried Desitin/hydrocortisone cream with little effect     Past medical history, surgical history, and family history reviewed.  Current medication list and allergy/intolerance information reviewed.   (See remainder of HPI, ROS, Phys Exam below)    ASSESSMENT/PLAN:   Tinea cruris  Need for influenza vaccination - Plan: Flu Vaccine QUAD 6+ mos PF IM (Fluarix Quad PF)   Meds ordered this encounter  Medications  . Clotrimazole POWD    Sig: 1 application by Does not apply route 2 (two) times daily.    Dispense:  500 g    Refill:  1  . clotrimazole (LOTRIMIN) 1 % cream    Sig: Apply 1 application topically 2 (two) times daily. Continue for 7 days after resolution of rash    Dispense:  60 g    Refill:  1  . fluconazole (DIFLUCAN) 150 MG tablet    Sig: Take 1 tablet (150 mg total) by mouth once for 1 dose. Repeat dose 72 hours    Dispense:  2 tablet    Refill:  1    Patient Instructions  I sent prescriptions for cream and powder to apply directly to the area, you can use whichever one you prefer.  I also sent a prescription for oral medications.  If rash is not improving, please let me know.  Okay to take Benadryl for itching but please avoid steroid creams   Follow-up plan: Return if symptoms worsen or fail to improve.                          ############################################ ############################################ ############################################ ############################################    Outpatient Encounter Medications as of 07/16/2018  Medication Sig  Note  . Cholecalciferol (D3 ADULT PO) Take 1,000 mg by mouth.   . Cyanocobalamin (B-12 PO) Take by mouth.   . estradiol (ESTRACE) 1 MG tablet  07/07/2016: Received from: External Pharmacy  . Loratadine 10 MG CAPS Take by mouth.   . Misc Natural Products (OSTEO BI-FLEX JOINT SHIELD PO) Take by mouth.   . Multiple Vitamins-Minerals (MULTIVITAMIN PO) Take by mouth.   Marland Kitchen omeprazole (PRILOSEC) 20 MG capsule Take 20 mg by mouth daily.    No facility-administered encounter medications on file as of 07/16/2018.    Allergies  Allergen Reactions  . Morphine And Related Itching  . Sulfa Antibiotics Hives  . Cephalexin Other (See Comments)    "funny feeling"      Review of Systems:  Constitutional: No recent illness  HEENT: No  Headache  Cardiac: No  chest pain, No  pressure, No palpitations  Respiratory:  No  shortness of breath.   Musculoskeletal: No new myalgia/arthralgia  Skin: +Rash  Neurologic: No  weakness, No  Dizziness   Exam:  BP 124/71 (BP Location: Left Arm, Patient Position: Sitting, Cuff Size: Normal)   Pulse 77   Temp 98.2 F (36.8 C) (Oral)   Wt 199 lb 1.6 oz (90.3 kg)   BMI 34.18 kg/m   Constitutional: VS see above. General Appearance: alert, well-developed, well-nourished, NAD  Respiratory: Normal respiratory effort.   Neurological: Normal balance/coordination.  No tremor.  Skin: warm, intact. Maculopapular rash, reddish, bilateral groin, no ulceration or drainage.   Psychiatric: Normal judgment/insight. Normal mood and affect. Oriented x3.   Visit summary with medication list and pertinent instructions was printed for patient to review, advised to alert [place] if any changes needed. All questions at time of visit were answered - patient instructed to contact office with any additional concerns. ER/RTC precautions were reviewed with the patient and understanding verbalized.   Follow-up plan: Return if symptoms worsen or fail to improve.    Please note:  voice recognition software was used to produce this document, and typos may escape review. Please contact Dr. Sheppard Coil for any needed clarifications.

## 2018-07-16 NOTE — Patient Instructions (Signed)
I sent prescriptions for cream and powder to apply directly to the area, you can use whichever one you prefer.  I also sent a prescription for oral medications.  If rash is not improving, please let me know.  Okay to take Benadryl for itching but please avoid steroid creams

## 2018-12-27 DIAGNOSIS — M19041 Primary osteoarthritis, right hand: Secondary | ICD-10-CM | POA: Insufficient documentation

## 2019-02-26 ENCOUNTER — Encounter: Payer: Self-pay | Admitting: Osteopathic Medicine

## 2019-02-26 ENCOUNTER — Ambulatory Visit: Payer: BC Managed Care – PPO | Admitting: Osteopathic Medicine

## 2019-02-26 ENCOUNTER — Other Ambulatory Visit: Payer: Self-pay

## 2019-02-26 VITALS — BP 134/76 | HR 76 | Temp 98.4°F | Wt 200.0 lb

## 2019-02-26 DIAGNOSIS — S61211A Laceration without foreign body of left index finger without damage to nail, initial encounter: Secondary | ICD-10-CM

## 2019-02-26 NOTE — Progress Notes (Signed)
HPI: Amanda Walters is a 63 y.o. female who  has no past medical history on file.  she presents to [facility] today, 02/26/19,  for chief complaint of:  Suture removal   . Context: ED visit 02/16/2019 (9 days ago) for finger laceration, pinched in log splitter.   . Location: L index finger    BP Readings from Last 3 Encounters:  02/26/19 134/76  07/16/18 124/71  11/06/17 132/79       At today's visit 02/26/19 ... PMH, PSH, FH reviewed and updated as needed.  Current medication list and allergy/intolerance hx reviewed and updated as needed. (See remainder of HPI, ROS, Phys Exam below)   No results found.  No results found for this or any previous visit (from the past 72 hour(s)).        ASSESSMENT/PLAN: The encounter diagnosis was Laceration of left index finger without foreign body, nail damage status unspecified, initial encounter.   Sutures removed without difficulty  Appears to be clotted blood under skin, no obvious necrotic tissue, normal pain for injury, pt to monitor and f/u prn       Follow-up plan: Return if symptoms worsen or fail to improve.                                                 ################################################# ################################################# ################################################# #################################################    Current Meds  Medication Sig  . Cholecalciferol (D3 ADULT PO) Take 1,000 mg by mouth.  . clotrimazole (LOTRIMIN) 1 % cream Apply 1 application topically 2 (two) times daily. Continue for 7 days after resolution of rash  . Clotrimazole POWD 1 application by Does not apply route 2 (two) times daily.  . Cyanocobalamin (B-12 PO) Take by mouth.  . estradiol (ESTRACE) 1 MG tablet   . Loratadine 10 MG CAPS Take by mouth.  . Misc Natural Products (OSTEO BI-FLEX JOINT SHIELD PO) Take  by mouth.  . Multiple Vitamins-Minerals (MULTIVITAMIN PO) Take by mouth.  Marland Kitchen omeprazole (PRILOSEC) 20 MG capsule Take 20 mg by mouth daily.    Allergies  Allergen Reactions  . Morphine And Related Itching  . Sulfa Antibiotics Hives  . Morphine Hives  . Cephalexin Other (See Comments)    "funny feeling"       Review of Systems:  Constitutional: No recent illness  Musculoskeletal: No new myalgia/arthralgia  Skin: No  Rash, wound as per HPI   Neurologic: No  weakness, No  Dizziness   Exam:  BP 134/76 (BP Location: Right Arm, Patient Position: Sitting, Cuff Size: Normal)   Pulse 76   Temp 98.4 F (36.9 C) (Oral)   Wt 200 lb (90.7 kg)   BMI 34.33 kg/m   Constitutional: VS see above. General Appearance: alert, well-developed, well-nourished, NAD  Eyes: Normal lids and conjunctive, non-icteric sclera  Ears, Nose, Mouth, Throat: MMM, Normal external inspection ears/nares/mouth/lips/gums.  Neck: No masses, trachea midline.   Respiratory: Normal respiratory effort.   Musculoskeletal: normal ROM and strength L index finger   Neurological: Normal balance/coordination. No tremor.  Skin: warm, dry, intact.   Psychiatric: Normal judgment/insight. Normal mood and affect. Oriented x3.       Visit summary with medication list and pertinent instructions was printed for patient to review, patient was advised to alert [place] if any updates are needed. All questions at time of  visit were answered - patient instructed to contact office with any additional concerns. ER/RTC precautions were reviewed with the patient and understanding verbalized.    Please note: voice recognition software was used to produce this document, and typos may escape review. Please contact Dr. Sheppard Coil for any needed clarifications.    Follow up plan: Return if symptoms worsen or fail to improve.

## 2019-11-25 ENCOUNTER — Other Ambulatory Visit: Payer: Self-pay

## 2019-11-25 ENCOUNTER — Encounter: Payer: Self-pay | Admitting: Nurse Practitioner

## 2019-11-25 ENCOUNTER — Ambulatory Visit (INDEPENDENT_AMBULATORY_CARE_PROVIDER_SITE_OTHER): Payer: BC Managed Care – PPO | Admitting: Nurse Practitioner

## 2019-11-25 VITALS — BP 133/78 | HR 82 | Temp 98.1°F | Ht 64.0 in | Wt 195.7 lb

## 2019-11-25 DIAGNOSIS — Z0000 Encounter for general adult medical examination without abnormal findings: Secondary | ICD-10-CM | POA: Diagnosis not present

## 2019-11-25 DIAGNOSIS — E039 Hypothyroidism, unspecified: Secondary | ICD-10-CM | POA: Diagnosis not present

## 2019-11-25 DIAGNOSIS — Z111 Encounter for screening for respiratory tuberculosis: Secondary | ICD-10-CM

## 2019-11-25 NOTE — Patient Instructions (Signed)

## 2019-11-25 NOTE — Progress Notes (Signed)
BP 133/78   Pulse 82   Temp 98.1 F (36.7 C) (Oral)   Ht 5\' 4"  (1.626 m)   Wt 195 lb 11.2 oz (88.8 kg)   SpO2 98%   BMI 33.59 kg/m    Subjective:    Patient ID: Amanda Walters, female    DOB: Apr 07, 1956, 64 y.o.   MRN: [id number]  HPI: Amanda Walters is a 64 y.o. female presenting on 11/25/2019 for comprehensive medical examination. Current medical complaints include:none  Healthy 64 year old female presenting today for annual physical exam.  She is returning to work as an Editor, commissioning and needs paperwork filled out as well as a TB test.  She reports annual eye exams with ophthalmology.  She reports annual dermatology exams with Dr. Derrel Nip at [facility] dermatology.  She receives her mammograms, DEXA scan, and Pap smears with Dr. Orene Desanctis at [place].  She reports she is due for a mammogram this October.  And reports her last DEXA scan was in 2019 and there were no issues at that time.  She states she is fairly active riding a recumbent bike approximately 20 minutes a day.  She currently lives with: Two dogs- Dixie and Belle Menopausal Symptoms: no  Depression Screen done today and results listed below:  Depression screen [facility] 2/9 11/25/2019 07/16/2018 07/11/2017  Decreased Interest 0 0 0  Down, Depressed, Hopeless 0 0 0  PHQ - 2 Score 0 0 0  Altered sleeping 0 0 -  Tired, decreased energy 0 0 -  Change in appetite 0 0 -  Feeling bad or failure about yourself  0 0 -  Trouble concentrating 0 0 -  Moving slowly or fidgety/restless 0 0 -  Suicidal thoughts 0 0 -  PHQ-9 Score 0 0 -  Difficult doing work/chores Not difficult at all - -    The patient does not have a history of falls. I did complete a risk assessment for falls. A plan of care for falls was not documented.   Past Medical History:  History reviewed. No pertinent past medical history.  Surgical History:  Past Surgical History:  Procedure Laterality Date  . ABDOMINAL HYSTERECTOMY  1993  . CHOLECYSTECTOMY     . HERNIA REPAIR     ventral hernia   . KNEE ARTHROSCOPY  2006  . KNEE ARTHROSCOPY  2012  . TOTAL KNEE ARTHROPLASTY  2011    Medications:  Current Outpatient Medications on File Prior to Visit  Medication Sig  . estradiol (ESTRACE) 1 MG tablet   . Loratadine 10 MG CAPS Take by mouth.  . Misc Natural Products (OSTEO BI-FLEX JOINT SHIELD PO) Take by mouth.  . Multiple Vitamins-Minerals (MULTIVITAMIN PO) Take by mouth.  Marland Kitchen omeprazole (PRILOSEC) 20 MG capsule Take 20 mg by mouth daily.  . Cholecalciferol (D3 ADULT PO) Take 1,000 mg by mouth.  . clotrimazole (LOTRIMIN) 1 % cream Apply 1 application topically 2 (two) times daily. Continue for 7 days after resolution of rash (Patient not taking: Reported on 11/25/2019)  . Clotrimazole POWD 1 application by Does not apply route 2 (two) times daily. (Patient not taking: Reported on 11/25/2019)  . Cyanocobalamin (B-12 PO) Take by mouth.   No current facility-administered medications on file prior to visit.    Allergies:  Allergies  Allergen Reactions  . Morphine And Related Itching  . Sulfa Antibiotics Hives  . Morphine Hives  . Cephalexin Other (See Comments)    "funny feeling"    Social History:  Social  History   Socioeconomic History  . Marital status: Single    Spouse name: Not on file  . Number of children: Not on file  . Years of education: Not on file  . Highest education level: Not on file  Occupational History  . Occupation: EC Teacher  Tobacco Use  . Smoking status: Never Smoker  . Smokeless tobacco: Never Used  Substance and Sexual Activity  . Alcohol use: Not Currently  . Drug use: Never  . Sexual activity: Not Currently  Other Topics Concern  . Not on file  Social History Narrative  . Not on file   Social Determinants of Health   Financial Resource Strain:   . Difficulty of Paying Living Expenses: Not on file  Food Insecurity:   . Worried About Charity fundraiser in the Last Year: Not on file  . Ran  Out of Food in the Last Year: Not on file  Transportation Needs:   . Lack of Transportation (Medical): Not on file  . Lack of Transportation (Non-Medical): Not on file  Physical Activity:   . Days of Exercise per Week: Not on file  . Minutes of Exercise per Session: Not on file  Stress:   . Feeling of Stress : Not on file  Social Connections:   . Frequency of Communication with Friends and Family: Not on file  . Frequency of Social Gatherings with Friends and Family: Not on file  . Attends Religious Services: Not on file  . Active Member of Clubs or Organizations: Not on file  . Attends Archivist Meetings: Not on file  . Marital Status: Not on file  Intimate Partner Violence:   . Fear of Current or Ex-Partner: Not on file  . Emotionally Abused: Not on file  . Physically Abused: Not on file  . Sexually Abused: Not on file   Social History   Tobacco Use  Smoking Status Never Smoker  Smokeless Tobacco Never Used   Social History   Substance and Sexual Activity  Alcohol Use Not Currently    Family History:  Family History  Problem Relation Age of Onset  . Heart attack Other        mother     Past medical history, surgical history, medications, allergies, family history and social history reviewed with patient today and changes made to appropriate areas of the chart.   Review of Systems - Negative except back pain with heavy lifiting All other ROS negative except what is listed above and in the HPI.      Objective:    BP 133/78   Pulse 82   Temp 98.1 F (36.7 C) (Oral)   Ht 5\' 4"  (1.626 m)   Wt 195 lb 11.2 oz (88.8 kg)   SpO2 98%   BMI 33.59 kg/m   Wt Readings from Last 3 Encounters:  11/25/19 195 lb 11.2 oz (88.8 kg)  02/26/19 200 lb (90.7 kg)  07/16/18 199 lb 1.6 oz (90.3 kg)    Physical Exam Vitals and nursing note reviewed.  Constitutional:      Appearance: Normal appearance. She is obese.  HENT:     Head: Normocephalic and atraumatic.      Right Ear: Tympanic membrane normal.     Left Ear: Tympanic membrane normal.     Nose: Nose normal.     Mouth/Throat:     Mouth: Mucous membranes are moist.     Pharynx: Oropharynx is clear.  Eyes:     Extraocular  Movements: Extraocular movements intact.     Conjunctiva/sclera: Conjunctivae normal.     Pupils: Pupils are equal, round, and reactive to light.  Neck:     Vascular: No carotid bruit.  Cardiovascular:     Rate and Rhythm: Normal rate and regular rhythm.     Pulses: Normal pulses.     Heart sounds: Normal heart sounds.  Pulmonary:     Effort: Pulmonary effort is normal.     Breath sounds: Normal breath sounds.  Abdominal:     General: Bowel sounds are normal. There is no distension.     Palpations: Abdomen is soft.     Tenderness: There is no abdominal tenderness. There is no right CVA tenderness, left CVA tenderness or guarding.  Musculoskeletal:        General: No swelling or tenderness. Normal range of motion.     Cervical back: Normal range of motion. No tenderness.     Right lower leg: No edema.     Left lower leg: No edema.  Lymphadenopathy:     Cervical: No cervical adenopathy.  Skin:    General: Skin is warm and dry.     Capillary Refill: Capillary refill takes less than 2 seconds.  Neurological:     General: No focal deficit present.     Mental Status: She is alert and oriented to person, place, and time.     Cranial Nerves: No cranial nerve deficit.     Motor: No weakness.     Gait: Gait normal.  Psychiatric:        Mood and Affect: Mood normal.        Behavior: Behavior normal.    Results for orders placed or performed in visit on 11/15/17  Cologuard  Result Value Ref Range   Cologuard        Assessment & Plan:   1. Encounter for annual physical exam Unremarkable physical exam.   Will obtain annual labs today.  Patient declines offer for HIV screening.  She does have a history of elevated TSH in 2019, will recheck this today. Information  provided on healthy lifestyle and all questions answered. Will notify patient of laboratory results and will have TB skin test read after 9:45 AM on Wednesday 11/27/19.    - CBC with Differential/Platelet - COMPLETE METABOLIC PANEL WITH GFR - TSH - TB Skin Test  2. Borderline hypothyroidism History of elevated TSH.  Will recheck today - TSH   Follow up plan: Return in about 2 days (around 11/27/2019) for Nurse Visit- TB read.  Annual exam follow-up in appx 1 year or sooner if needed.   LABORATORY TESTING:  - Pap smear: up to date- [place] Orene Desanctis)  IMMUNIZATIONS:   - Tdap: Tetanus vaccination status reviewed: last tetanus booster within 10 years. - Influenza: Up to date - Pneumovax: Not applicable - Prevnar: Not applicable - HPV: Not applicable - Zostavax vaccine: Not applicable  SCREENING: -Mammogram: Up to date  - Colonoscopy: Up to date  - Bone Density: Up to date  -Hearing Test: Up to date  -Spirometry: Not applicable   PATIENT COUNSELING:   Advised to take 1 mg of folate supplement per day if capable of pregnancy.   Sexuality: Discussed sexually transmitted diseases, partner selection, use of condoms, avoidance of unintended pregnancy  and contraceptive alternatives.   Advised to avoid cigarette smoking.  I discussed with the patient that most people either abstain from alcohol or drink within safe limits (<=14/week and <=4 drinks/occasion for  males, <=7/weeks and <= 3 drinks/occasion for females) and that the risk for alcohol disorders and other health effects rises proportionally with the number of drinks per week and how often a drinker exceeds daily limits.  Discussed cessation/primary prevention of drug use and availability of treatment for abuse.   Diet: Encouraged to adjust caloric intake to maintain  or achieve ideal body weight, to reduce intake of dietary saturated fat and total fat, to limit sodium intake by avoiding high sodium foods and not adding  table salt, and to maintain adequate dietary potassium and calcium preferably from fresh fruits, vegetables, and low-fat dairy products.    stressed the importance of regular exercise  Injury prevention: Discussed safety belts, safety helmets, smoke detector, smoking near bedding or upholstery.   Dental health: Discussed importance of regular tooth brushing, flossing, and dental visits.    NEXT PREVENTATIVE PHYSICAL DUE IN 1 YEAR. Return in about 2 days (around 11/27/2019) for Nurse Visit- TB read.

## 2019-11-26 LAB — CBC WITH DIFFERENTIAL/PLATELET
Absolute Monocytes: 637 cells/uL (ref 200–950)
Basophils Absolute: 70 cells/uL (ref 0–200)
Basophils Relative: 1 %
Eosinophils Absolute: 490 cells/uL (ref 15–500)
Eosinophils Relative: 7 %
HCT: 42.4 % (ref 35.0–45.0)
Hemoglobin: 14.3 g/dL (ref 11.7–15.5)
Lymphs Abs: 2030 cells/uL (ref 850–3900)
MCH: 30.3 pg (ref 27.0–33.0)
MCHC: 33.7 g/dL (ref 32.0–36.0)
MCV: 89.8 fL (ref 80.0–100.0)
MPV: 9.7 fL (ref 7.5–12.5)
Monocytes Relative: 9.1 %
Neutro Abs: 3773 cells/uL (ref 1500–7800)
Neutrophils Relative %: 53.9 %
Platelets: 252 10*3/uL (ref 140–400)
RBC: 4.72 10*6/uL (ref 3.80–5.10)
RDW: 12.4 % (ref 11.0–15.0)
Total Lymphocyte: 29 %
WBC: 7 10*3/uL (ref 3.8–10.8)

## 2019-11-26 LAB — COMPLETE METABOLIC PANEL WITH GFR
AG Ratio: 1.7 (calc) (ref 1.0–2.5)
ALT: 20 U/L (ref 6–29)
AST: 22 U/L (ref 10–35)
Albumin: 4.3 g/dL (ref 3.6–5.1)
Alkaline phosphatase (APISO): 57 U/L (ref 37–153)
BUN: 16 mg/dL (ref 7–25)
CO2: 26 mmol/L (ref 20–32)
Calcium: 9.8 mg/dL (ref 8.6–10.4)
Chloride: 104 mmol/L (ref 98–110)
Creat: 0.89 mg/dL (ref 0.50–0.99)
GFR, Est African American: 79 mL/min/{1.73_m2} (ref >=60)
GFR, Est Non African American: 68 mL/min/{1.73_m2} (ref >=60)
Globulin: 2.6 g/dL (calc) (ref 1.9–3.7)
Glucose, Bld: 79 mg/dL (ref 65–139)
Potassium: 4.4 mmol/L (ref 3.5–5.3)
Sodium: 139 mmol/L (ref 135–146)
Total Bilirubin: 0.3 mg/dL (ref 0.2–1.2)
Total Protein: 6.9 g/dL (ref 6.1–8.1)

## 2019-11-26 LAB — LIPID PANEL
Cholesterol: 196 mg/dL (ref <200)
HDL: 49 mg/dL — ABNORMAL LOW (ref >=50)
LDL Cholesterol (Calc): 111 mg/dL (calc) — ABNORMAL HIGH
Non-HDL Cholesterol (Calc): 147 mg/dL (calc) — ABNORMAL HIGH (ref <130)
Total CHOL/HDL Ratio: 4 (calc) (ref <5.0)
Triglycerides: 250 mg/dL — ABNORMAL HIGH (ref <150)

## 2019-11-26 LAB — TSH: TSH: 4.5 mIU/L (ref 0.40–4.50)

## 2019-11-27 ENCOUNTER — Ambulatory Visit (INDEPENDENT_AMBULATORY_CARE_PROVIDER_SITE_OTHER): Payer: BC Managed Care – PPO | Admitting: Family Medicine

## 2019-11-27 VITALS — Ht 64.0 in | Wt 195.0 lb

## 2019-11-27 DIAGNOSIS — Z111 Encounter for screening for respiratory tuberculosis: Secondary | ICD-10-CM | POA: Diagnosis not present

## 2019-11-27 LAB — TB SKIN TEST
Induration: 0 mm
TB Skin Test: NEGATIVE

## 2019-11-27 NOTE — Progress Notes (Signed)
Patient presents to clinic for a TB skin test read. The TB skin test was negative. It was not raise and was 0 mm. Patient was given a copy of the paperwork and a copy was sent to scan. No other questions.

## 2020-02-13 DIAGNOSIS — M13841 Other specified arthritis, right hand: Secondary | ICD-10-CM | POA: Diagnosis not present

## 2020-02-13 DIAGNOSIS — M65351 Trigger finger, right little finger: Secondary | ICD-10-CM | POA: Diagnosis not present

## 2020-02-13 DIAGNOSIS — M1812 Unilateral primary osteoarthritis of first carpometacarpal joint, left hand: Secondary | ICD-10-CM | POA: Diagnosis not present

## 2020-02-19 DIAGNOSIS — M170 Bilateral primary osteoarthritis of knee: Secondary | ICD-10-CM | POA: Diagnosis not present

## 2020-02-19 DIAGNOSIS — M25562 Pain in left knee: Secondary | ICD-10-CM | POA: Diagnosis not present

## 2020-02-19 DIAGNOSIS — M1712 Unilateral primary osteoarthritis, left knee: Secondary | ICD-10-CM | POA: Insufficient documentation

## 2020-02-19 DIAGNOSIS — M1711 Unilateral primary osteoarthritis, right knee: Secondary | ICD-10-CM | POA: Diagnosis not present

## 2020-02-25 ENCOUNTER — Telehealth: Payer: Self-pay

## 2020-02-25 NOTE — Telephone Encounter (Signed)
Amanda Walters called yesterday and left a message stating she has sinus problems. I called and left a message for a return call.

## 2020-02-25 NOTE — Telephone Encounter (Signed)
Amanda Walters called back and she has been scheduled for a virtual visit.

## 2020-02-26 ENCOUNTER — Telehealth (INDEPENDENT_AMBULATORY_CARE_PROVIDER_SITE_OTHER): Payer: PPO | Admitting: Osteopathic Medicine

## 2020-02-26 VITALS — BP 128/68 | Temp 97.3°F | Ht 64.0 in | Wt 190.0 lb

## 2020-02-26 DIAGNOSIS — R0981 Nasal congestion: Secondary | ICD-10-CM | POA: Diagnosis not present

## 2020-02-26 MED ORDER — IPRATROPIUM BROMIDE 0.06 % NA SOLN: 2.0000 | Freq: Four times a day (QID) | NASAL | 1 refills | Status: DC

## 2020-02-26 MED ORDER — PREDNISONE 20 MG PO TABS: 20.0000 mg | ORAL_TABLET | Freq: Two times a day (BID) | ORAL | 0 refills | Status: DC

## 2020-02-26 MED ORDER — PHENYLEPHRINE HCL 10 MG PO TABS: 10.0000 mg | ORAL_TABLET | Freq: Three times a day (TID) | ORAL | 0 refills | Status: DC

## 2020-02-26 NOTE — Progress Notes (Signed)
Virtual Visit via Phone  I connected with      Amanda Walters on 02/26/20 at 2:26 PM  by a telemedicine application and verified that I am speaking with the correct person using two identifiers.  Patient is at home I am in office   I discussed the limitations of evaluation and management by telemedicine and the availability of in person appointments. The patient expressed understanding and agreed to proceed.  History of Present Illness: Amanda Walters is a 64 y.o. female who would like to discuss Sinus   Started 3-4 days ago: Congestion/ sinus drainage Cough due to drainage Sore throat (gone now) Left Ear - stopped up (was swimming)   Taking mucinex - helped a lot  Not taking allergy meds/nasal sprays     Observations/Objective: BP 128/68   Temp (!) 97.3 F (36.3 C) (Temporal)   Ht 5\' 4"  (1.626 m)   Wt 190 lb (86.2 kg)   BMI 32.61 kg/m  BP Readings from Last 3 Encounters:  02/26/20 128/68  11/25/19 133/78  02/26/19 134/76   Exam: Normal Speech.  NAD  Lab and Radiology Results No results found for this or any previous visit (from the past 72 hour(s)). No results found.     Assessment and Plan: 64 y.o. female with The encounter diagnosis was Sinus congestion.  Sounds like allergies (pt reports seasonal issues this time of year is common) or eustachian tube df rather than true bacterial sinusitis, pt amenable to symptomatic care, continue loratidine, would conider adding floanse this time of year    PDMP not reviewed this encounter. No orders of the defined types were placed in this encounter.  Meds ordered this encounter  Medications  . predniSONE (DELTASONE) 20 MG tablet    Sig: Take 1 tablet (20 mg total) by mouth 2 (two) times daily with a meal.    Dispense:  10 tablet    Refill:  0  . ipratropium (ATROVENT) 0.06 % nasal spray    Sig: Place 2 sprays into both nostrils 4 (four) times daily. As needed for rhinitis / congestion    Dispense:  15  mL    Refill:  1  . phenylephrine (SUDAFED PE) 10 MG TABS tablet    Sig: Take 1 tablet (10 mg total) by mouth every 8 (eight) hours.    Dispense:  30 tablet    Refill:  0   Follow Up Instructions: Return if symptoms worsen or fail to improve.    I discussed the assessment and treatment plan with the patient. The patient was provided an opportunity to ask questions and all were answered. The patient agreed with the plan and demonstrated an understanding of the instructions.   The patient was advised to call back or seek an in-person evaluation if any new concerns, if symptoms worsen or if the condition fails to improve as anticipated.  15 minutes of non-face-to-face time was provided during this encounter.      . . . . . . . . . . . . . Marland Kitchen                   Historical information moved to improve visibility of documentation.  No past medical history on file. Past Surgical History:  Procedure Laterality Date  . ABDOMINAL HYSTERECTOMY  1993  . CHOLECYSTECTOMY    . HERNIA REPAIR     ventral hernia   . KNEE ARTHROSCOPY  2006  . KNEE ARTHROSCOPY  2012  .  TOTAL KNEE ARTHROPLASTY  2011   Social History   Tobacco Use  . Smoking status: Never Smoker  . Smokeless tobacco: Never Used  Substance Use Topics  . Alcohol use: Not Currently   family history includes Heart attack in an other family member.  Medications: Current Outpatient Medications  Medication Sig Dispense Refill  . ascorbic acid (VITAMIN C) 500 MG tablet Vitamin C 500 mg tablet   2 tablets every day by oral route.    Marland Kitchen estradiol (ESTRACE) 1 MG tablet     . Loratadine 10 MG CAPS Take by mouth.    . Misc Natural Products (COSAMIN ASU ADVANCED FORMULA PO) Take by mouth.    . Multiple Vitamins-Minerals (MULTIVITAMIN PO) Take by mouth.    Marland Kitchen omeprazole (PRILOSEC) 20 MG capsule Take 20 mg by mouth daily.    . vitamin B-12 (CYANOCOBALAMIN) 1000 MCG tablet Take 1,000 mcg by mouth daily.    Marland Kitchen  ipratropium (ATROVENT) 0.06 % nasal spray Place 2 sprays into both nostrils 4 (four) times daily. As needed for rhinitis / congestion 15 mL 1  . phenylephrine (SUDAFED PE) 10 MG TABS tablet Take 1 tablet (10 mg total) by mouth every 8 (eight) hours. 30 tablet 0  . predniSONE (DELTASONE) 20 MG tablet Take 1 tablet (20 mg total) by mouth 2 (two) times daily with a meal. 10 tablet 0   No current facility-administered medications for this visit.   Allergies  Allergen Reactions  . Morphine And Related Itching  . Sulfa Antibiotics Hives  . Morphine Hives  . Cephalexin Other (See Comments)    "funny feeling"

## 2020-02-26 NOTE — Progress Notes (Signed)
Started 3-4 days ago: Congestion/ sinus drainage Cough due to drainage Sore throat (gone now) Left Ear - stopped up (was swimming)   Taking mucinex - helped a lot  Not taking allergy meds/nasal sprays

## 2020-03-12 DIAGNOSIS — M65351 Trigger finger, right little finger: Secondary | ICD-10-CM | POA: Diagnosis not present

## 2020-03-12 DIAGNOSIS — M65331 Trigger finger, right middle finger: Secondary | ICD-10-CM | POA: Diagnosis not present

## 2020-04-01 DIAGNOSIS — M1712 Unilateral primary osteoarthritis, left knee: Secondary | ICD-10-CM | POA: Diagnosis not present

## 2020-04-08 DIAGNOSIS — M1712 Unilateral primary osteoarthritis, left knee: Secondary | ICD-10-CM | POA: Diagnosis not present

## 2020-04-09 DIAGNOSIS — M65331 Trigger finger, right middle finger: Secondary | ICD-10-CM | POA: Diagnosis not present

## 2020-04-09 DIAGNOSIS — M65351 Trigger finger, right little finger: Secondary | ICD-10-CM | POA: Diagnosis not present

## 2020-04-29 ENCOUNTER — Encounter: Payer: Self-pay | Admitting: Osteopathic Medicine

## 2020-04-29 DIAGNOSIS — L821 Other seborrheic keratosis: Secondary | ICD-10-CM | POA: Diagnosis not present

## 2020-04-29 DIAGNOSIS — D485 Neoplasm of uncertain behavior of skin: Secondary | ICD-10-CM | POA: Diagnosis not present

## 2020-04-29 DIAGNOSIS — L570 Actinic keratosis: Secondary | ICD-10-CM | POA: Diagnosis not present

## 2020-04-29 DIAGNOSIS — L720 Epidermal cyst: Secondary | ICD-10-CM | POA: Diagnosis not present

## 2020-04-29 DIAGNOSIS — L814 Other melanin hyperpigmentation: Secondary | ICD-10-CM | POA: Diagnosis not present

## 2020-04-29 DIAGNOSIS — D1801 Hemangioma of skin and subcutaneous tissue: Secondary | ICD-10-CM | POA: Diagnosis not present

## 2020-04-29 DIAGNOSIS — Z85828 Personal history of other malignant neoplasm of skin: Secondary | ICD-10-CM | POA: Diagnosis not present

## 2020-05-29 DIAGNOSIS — M25552 Pain in left hip: Secondary | ICD-10-CM | POA: Diagnosis not present

## 2020-05-29 DIAGNOSIS — M1712 Unilateral primary osteoarthritis, left knee: Secondary | ICD-10-CM | POA: Diagnosis not present

## 2020-06-01 ENCOUNTER — Encounter: Payer: Self-pay | Admitting: Osteopathic Medicine

## 2020-06-01 ENCOUNTER — Ambulatory Visit (INDEPENDENT_AMBULATORY_CARE_PROVIDER_SITE_OTHER): Payer: PPO | Admitting: Osteopathic Medicine

## 2020-06-01 VITALS — BP 141/69 | HR 89 | Temp 102.0°F | Wt 190.0 lb

## 2020-06-01 DIAGNOSIS — J329 Chronic sinusitis, unspecified: Secondary | ICD-10-CM | POA: Diagnosis not present

## 2020-06-01 DIAGNOSIS — R0981 Nasal congestion: Secondary | ICD-10-CM | POA: Diagnosis not present

## 2020-06-01 MED ORDER — AMOXICILLIN-POT CLAVULANATE 875-125 MG PO TABS
1.0000 | ORAL_TABLET | Freq: Two times a day (BID) | ORAL | 0 refills | Status: DC
Start: 2020-06-01 — End: 2020-06-05

## 2020-06-01 MED ORDER — IPRATROPIUM BROMIDE 0.06 % NA SOLN: 2.0000 | Freq: Four times a day (QID) | NASAL | 1 refills | Status: DC

## 2020-06-01 MED ORDER — BENZONATATE 200 MG PO CAPS
200.0000 mg | ORAL_CAPSULE | Freq: Two times a day (BID) | ORAL | 0 refills | Status: DC | PRN
Start: 2020-06-01 — End: 2020-08-10

## 2020-06-01 NOTE — Progress Notes (Signed)
Virtual Visit via Phone  I connected with      Amanda Walters on 06/01/20 at 10:11 AM  by a telemedicine application and verified that I am speaking with the correct person using two identifiers.  Patient is at home I am in office   I discussed the limitations of evaluation and management by telemedicine and the availability of in person appointments. The patient expressed understanding and agreed to proceed.  History of Present Illness: Amanda Walters is a 64 y.o. female who would like to discuss sinus issues Chief Complaint  Patient presents with  . Facial Pain  . Nasal Congestion  . Fever  . Cough   Onset 3 days ago  Woke up w/ chills and sinus pressure, R ear pain Cough ongoing Has been on prednisone from orthopedics         Observations/Objective: BP (!) 141/69   Pulse 89   Temp (!) 102 F (38.9 C) (Oral)   Wt 190 lb (86.2 kg)   BMI 32.61 kg/m  BP Readings from Last 3 Encounters:  06/01/20 (!) 141/69  02/26/20 128/68  11/25/19 133/78   Exam: Normal Speech.  NAD speaking in full sentences   Lab and Radiology Results No results found for this or any previous visit (from the past 72 hour(s)). No results found.     Assessment and Plan: 64 y.o. female with The primary encounter diagnosis was Sinus congestion. A diagnosis of Sinusitis, unspecified chronicity, unspecified location was also pertinent to this visit.  Pt not COVID vaccinated Pt declined COVID testing I advised isolation 10 days just to be safe Symptomatic care as below, abx for presumed sinusitis  If worsening fever, cough, SOB or other concerns needs evaluation   PDMP not reviewed this encounter. No orders of the defined types were placed in this encounter.  Meds ordered this encounter  Medications  . amoxicillin-clavulanate (AUGMENTIN) 875-125 MG tablet    Sig: Take 1 tablet by mouth 2 (two) times daily.    Dispense:  14 tablet    Refill:  0  . benzonatate (TESSALON) 200 MG  capsule    Sig: Take 1 capsule (200 mg total) by mouth 2 (two) times daily as needed for cough.    Dispense:  20 capsule    Refill:  0  . ipratropium (ATROVENT) 0.06 % nasal spray    Sig: Place 2 sprays into both nostrils 4 (four) times daily. As needed for rhinitis / congestion    Dispense:  15 mL    Refill:  1     Follow Up Instructions: Return if symptoms worsen or fail to improve CALL OR 911/ER.    I discussed the assessment and treatment plan with the patient. The patient was provided an opportunity to ask questions and all were answered. The patient agreed with the plan and demonstrated an understanding of the instructions.   The patient was advised to call back or seek an in-person evaluation if any new concerns, if symptoms worsen or if the condition fails to improve as anticipated.  21 minutes of non-face-to-face time was provided during this encounter.      . . . . . . . . . . . . . Marland Kitchen                   Historical information moved to improve visibility of documentation.  No past medical history on file. Past Surgical History:  Procedure Laterality Date  . ABDOMINAL HYSTERECTOMY  [place]    . HERNIA REPAIR     ventral hernia   . KNEE ARTHROSCOPY  2006  . KNEE ARTHROSCOPY  2012  . TOTAL KNEE ARTHROPLASTY  2011   Social History   Tobacco Use  . Smoking status: Never Smoker  . Smokeless tobacco: Never Used  Substance Use Topics  . Alcohol use: Not Currently   family history includes Heart attack in an other family member.  Medications: Current Outpatient Medications  Medication Sig Dispense Refill  . ascorbic acid (VITAMIN C) 500 MG tablet Vitamin C 500 mg tablet   2 tablets every day by oral route.    Marland Kitchen estradiol (ESTRACE) 1 MG tablet     . Loratadine 10 MG CAPS Take by mouth.    . Misc Natural Products (COSAMIN ASU ADVANCED FORMULA PO) Take by mouth.    . Multiple Vitamins-Minerals (MULTIVITAMIN PO) Take by  mouth.    Marland Kitchen omeprazole (PRILOSEC) 20 MG capsule Take 20 mg by mouth daily.    . predniSONE (DELTASONE) 20 MG tablet Take 1 tablet (20 mg total) by mouth 2 (two) times daily with a meal. 10 tablet 0  . vitamin B-12 (CYANOCOBALAMIN) 1000 MCG tablet Take 1,000 mcg by mouth daily.    Marland Kitchen amoxicillin-clavulanate (AUGMENTIN) 875-125 MG tablet Take 1 tablet by mouth 2 (two) times daily. 14 tablet 0  . benzonatate (TESSALON) 200 MG capsule Take 1 capsule (200 mg total) by mouth 2 (two) times daily as needed for cough. 20 capsule 0  . ipratropium (ATROVENT) 0.06 % nasal spray Place 2 sprays into both nostrils 4 (four) times daily. As needed for rhinitis / congestion 15 mL 1   No current facility-administered medications for this visit.   Allergies  Allergen Reactions  . Morphine And Related Itching  . Sulfa Antibiotics Hives  . Morphine Hives  . Cephalexin Other (See Comments)    "funny feeling"

## 2020-06-02 DIAGNOSIS — Z20822 Contact with and (suspected) exposure to covid-19: Secondary | ICD-10-CM | POA: Diagnosis not present

## 2020-06-05 ENCOUNTER — Other Ambulatory Visit: Payer: Self-pay | Admitting: Nurse Practitioner

## 2020-06-05 ENCOUNTER — Encounter: Payer: Self-pay | Admitting: Nurse Practitioner

## 2020-06-05 ENCOUNTER — Telehealth (INDEPENDENT_AMBULATORY_CARE_PROVIDER_SITE_OTHER): Payer: PPO | Admitting: Nurse Practitioner

## 2020-06-05 ENCOUNTER — Telehealth: Payer: Self-pay | Admitting: Nurse Practitioner

## 2020-06-05 VITALS — BP 115/65 | HR 96 | Temp 102.0°F

## 2020-06-05 DIAGNOSIS — U071 COVID-19: Secondary | ICD-10-CM

## 2020-06-05 DIAGNOSIS — R509 Fever, unspecified: Secondary | ICD-10-CM

## 2020-06-05 DIAGNOSIS — R5081 Fever presenting with conditions classified elsewhere: Secondary | ICD-10-CM

## 2020-06-05 HISTORY — DX: COVID-19: U07.1

## 2020-06-05 NOTE — Progress Notes (Signed)
I connected by phone with Amanda Walters on 06/05/2020 at 1:32 PM to discuss the potential use of a new treatment for mild to moderate COVID-19 viral infection in non-hospitalized patients.  This patient is a 64 y.o. female that meets the FDA criteria for Emergency Use Authorization of COVID monoclonal antibody casirivimab/imdevimab.  Has a (+) direct SARS-CoV-2 viral test result  Has mild or moderate COVID-19   Is NOT hospitalized due to COVID-19  Is within 10 days of symptom onset  Has at least one of the high risk factor(s) for progression to severe COVID-19 and/or hospitalization as defined in EUA.  Specific high risk criteria : Older age (>/= 65 yo); BMI   I have spoken and communicated the following to the patient or parent/caregiver regarding COVID monoclonal antibody treatment:  1. FDA has authorized the emergency use for the treatment of mild to moderate COVID-19 in adults and pediatric patients with positive results of direct SARS-CoV-2 viral testing who are 66 years of age and older weighing at least 40 kg, and who are at high risk for progressing to severe COVID-19 and/or hospitalization.  2. The significant known and potential risks and benefits of COVID monoclonal antibody, and the extent to which such potential risks and benefits are unknown.  3. Information on available alternative treatments and the risks and benefits of those alternatives, including clinical trials.  4. Patients treated with COVID monoclonal antibody should continue to self-isolate and use infection control measures (e.g., wear mask, isolate, social distance, avoid sharing personal items, clean and disinfect "high touch" surfaces, and frequent handwashing) according to CDC guidelines.   5. The patient or parent/caregiver has the option to accept or refuse COVID monoclonal antibody treatment.  After reviewing this information with the patient, The patient agreed to proceed with receiving  casirivimab\imdevimab infusion and will be provided a copy of the Fact sheet prior to receiving the infusion.   [name] 06/05/2020 1:32 PM

## 2020-06-05 NOTE — Progress Notes (Signed)
Virtual Visit via Telephone Note  I connected with  Amanda Walters on 06/05/20 at  8:50 AM EDT by telephone and verified that I am speaking with the correct person using two identifiers.   I discussed the limitations, risks, security and privacy concerns of performing an evaluation and management service by telephone and the availability of in person appointments. I also discussed with the patient that there may be a patient responsible charge related to this service. The patient expressed understanding and agreed to proceed.  The patient is: at home I am: in the office  Subjective:    CC: COVID-19  HPI: Amanda Walters is a 64 year old female presenting today with a positive diagnosis of COVID-19 2 days ago from [facility] pharmacy testing site. She reports symptoms started approximately one week ago. She is not vaccinated.  She endorses fatigue, weakness, non-productive cough, headache, decreased appetite, and fever. Her worst reported symptom is her fever.   She endorses temperature of 102 at the time of office visit. She has been taking 400mg  of Ibuprofen every 4 hours for fever, which is not effective.   She denies shortness of breath, dizziness, confusion, difficulty breathing, or feeling like she cannot get enough air.    Past medical history, Surgical history, Family history not pertinant except as noted below, Social history, Allergies, and medications have been entered into the medical record, reviewed, and corrections made.   ROS: Pertinent positive and negatives in HPI.    Objective:    General: Speaking clearly in complete sentences without any shortness of breath.  Alert and oriented x3.  Normal judgment. No apparent acute distress.  Impression and Recommendations:   1. Fever due to COVID-19 Symptoms and presentation consistent with COVID-19 infection.  Recommend the patient alternate acetaminophen 1000mg  and ibuprofen 800mg  every 4 hours for relief of elevated temperature  and pain. Recommend increased hydration and rest. Avoid contact with other persons for at least 10 days after onset of symptoms or longer until symptoms have fully resolved.  Discussed monoclonal antibody infusion with the patient- she is interested in this option. Discussed that her information will be reviewed and she will be contacted about possible infusion.  Instructions provided on COVID-19 and patient encouraged to monitor for shortness of breath, difficulty breathing, or confusion and to seek emergency medical care if these symptoms present.  Follow-up if symptoms worsen or fail to improve.    I discussed the assessment and treatment plan with the patient. The patient was provided an opportunity to ask questions and all were answered. The patient agreed with the plan and demonstrated an understanding of the instructions.   The patient was advised to call back or seek an in-person evaluation if the symptoms worsen or if the condition fails to improve as anticipated.  I provided 20 minutes of non-face-to-face time during this TELEPHONE encounter.    Orma Render, NP

## 2020-06-05 NOTE — Assessment & Plan Note (Signed)
Symptoms and presentation consistent with COVID-19 infection.  Recommend the patient alternate acetaminophen 1000mg  and ibuprofen 800mg  every 4 hours for relief of elevated temperature and pain. Recommend increased hydration and rest. Avoid contact with other persons for at least 10 days after onset of symptoms or longer until symptoms have fully resolved.  Discussed monoclonal antibody infusion with the patient- she is interested in this option. Discussed that her information will be reviewed and she will be contacted about possible infusion.  Instructions provided on COVID-19 and patient encouraged to monitor for shortness of breath, difficulty breathing, or confusion and to seek emergency medical care if these symptoms present.  Follow-up if symptoms worsen or fail to improve.

## 2020-06-05 NOTE — Patient Instructions (Signed)
I recommend you alternate 800mg  of Ibuprofen and 1000mg  of Tylenol every 4 hours for fever and pain.  Your fever may last for several days.   If you begin to develop severe shortness of breath, dizziness, confusion, or feeling like you cannot get enough oxygen, please seek emergency care.   I will send your information for evaluation for the monoclonal antibody infusion treatment. Someone will call you about this treatment.    COVID-19 COVID-19 is a respiratory infection that is caused by a virus called severe acute respiratory syndrome coronavirus 2 (SARS-CoV-2). The disease is also known as coronavirus disease or novel coronavirus. In some people, the virus may not cause any symptoms. In others, it may cause a serious infection. The infection can get worse quickly and can lead to complications, such as:  Pneumonia, or infection of the lungs.  Acute respiratory distress syndrome or ARDS. This is a condition in which fluid build-up in the lungs prevents the lungs from filling with air and passing oxygen into the blood.  Acute respiratory failure. This is a condition in which there is not enough oxygen passing from the lungs to the body or when carbon dioxide is not passing from the lungs out of the body.  Sepsis or septic shock. This is a serious bodily reaction to an infection.  Blood clotting problems.  Secondary infections due to bacteria or fungus.  Organ failure. This is when your body's organs stop working. The virus that causes COVID-19 is contagious. This means that it can spread from person to person through droplets from coughs and sneezes (respiratory secretions). What are the causes? This illness is caused by a virus. You may catch the virus by:  Breathing in droplets from an infected person. Droplets can be spread by a person breathing, speaking, singing, coughing, or sneezing.  Touching something, like a table or a doorknob, that was exposed to the virus (contaminated) and  then touching your mouth, nose, or eyes. What increases the risk? Risk for infection You are more likely to be infected with this virus if you:  Are within 6 feet (2 meters) of a person with COVID-19.  Provide care for or live with a person who is infected with COVID-19.  Spend time in crowded indoor spaces or live in shared housing. Risk for serious illness You are more likely to become seriously ill from the virus if you:  Are 65 years of age or older. The higher your age, the more you are at risk for serious illness.  Live in a nursing home or long-term care facility.  Have cancer.  Have a long-term (chronic) disease such as: ? Chronic lung disease, including chronic obstructive pulmonary disease or asthma. ? A long-term disease that lowers your body's ability to fight infection (immunocompromised). ? Heart disease, including heart failure, a condition in which the arteries that lead to the heart become narrow or blocked (coronary artery disease), a disease which makes the heart muscle thick, weak, or stiff (cardiomyopathy). ? Diabetes. ? Chronic kidney disease. ? Sickle cell disease, a condition in which red blood cells have an abnormal "sickle" shape. ? Liver disease.  Are obese. What are the signs or symptoms? Symptoms of this condition can range from mild to severe. Symptoms may appear any time from 2 to 14 days after being exposed to the virus. They include:  A fever or chills.  A cough.  Difficulty breathing.  Headaches, body aches, or muscle aches.  Runny or stuffy (congested) nose.  A sore throat.  New loss of taste or smell. Some people may also have stomach problems, such as nausea, vomiting, or diarrhea. Other people may not have any symptoms of COVID-19. How is this diagnosed? This condition may be diagnosed based on:  Your signs and symptoms, especially if: ? You live in an area with a COVID-19 outbreak. ? You recently traveled to or from an area  where the virus is common. ? You provide care for or live with a person who was diagnosed with COVID-19. ? You were exposed to a person who was diagnosed with COVID-19.  A physical exam.  Lab tests, which may include: ? Taking a sample of fluid from the back of your nose and throat (nasopharyngeal fluid), your nose, or your throat using a swab. ? A sample of mucus from your lungs (sputum). ? Blood tests.  Imaging tests, which may include, X-rays, CT scan, or ultrasound. How is this treated? At present, there is no medicine to treat COVID-19. Medicines that treat other diseases are being used on a trial basis to see if they are effective against COVID-19. Your health care provider will talk with you about ways to treat your symptoms. For most people, the infection is mild and can be managed at home with rest, fluids, and over-the-counter medicines. Treatment for a serious infection usually takes places in a hospital intensive care unit (ICU). It may include one or more of the following treatments. These treatments are given until your symptoms improve.  Receiving fluids and medicines through an IV.  Supplemental oxygen. Extra oxygen is given through a tube in the nose, a face mask, or a hood.  Positioning you to lie on your stomach (prone position). This makes it easier for oxygen to get into the lungs.  Continuous positive airway pressure (CPAP) or bi-level positive airway pressure (BPAP) machine. This treatment uses mild air pressure to keep the airways open. A tube that is connected to a motor delivers oxygen to the body.  Ventilator. This treatment moves air into and out of the lungs by using a tube that is placed in your windpipe.  Tracheostomy. This is a procedure to create a hole in the neck so that a breathing tube can be inserted.  Extracorporeal membrane oxygenation (ECMO). This procedure gives the lungs a chance to recover by taking over the functions of the heart and lungs. It  supplies oxygen to the body and removes carbon dioxide. Follow these instructions at home: Lifestyle  If you are sick, stay home except to get medical care. Your health care provider will tell you how long to stay home. Call your health care provider before you go for medical care.  Rest at home as told by your health care provider.  Do not use any products that contain nicotine or tobacco, such as cigarettes, e-cigarettes, and chewing tobacco. If you need help quitting, ask your health care provider.  Return to your normal activities as told by your health care provider. Ask your health care provider what activities are safe for you. General instructions  Take over-the-counter and prescription medicines only as told by your health care provider.  Drink enough fluid to keep your urine pale yellow.  Keep all follow-up visits as told by your health care provider. This is important. How is this prevented?  There is no vaccine to help prevent COVID-19 infection. However, there are steps you can take to protect yourself and others from this virus. To protect yourself:  Do not travel to areas where COVID-19 is a risk. The areas where COVID-19 is reported change often. To identify high-risk areas and travel restrictions, check the CDC travel website: FatFares.com.br  If you live in, or must travel to, an area where COVID-19 is a risk, take precautions to avoid infection. ? Stay away from people who are sick. ? Wash your hands often with soap and water for 20 seconds. If soap and water are not available, use an alcohol-based hand sanitizer. ? Avoid touching your mouth, face, eyes, or nose. ? Avoid going out in public, follow guidance from your state and local health authorities. ? If you must go out in public, wear a cloth face covering or face mask. Make sure your mask covers your nose and mouth. ? Avoid crowded indoor spaces. Stay at least 6 feet (2 meters) away from  others. ? Disinfect objects and surfaces that are frequently touched every day. This may include:  Counters and tables.  Doorknobs and light switches.  Sinks and faucets.  Electronics, such as phones, remote controls, keyboards, computers, and tablets. To protect others: If you have symptoms of COVID-19, take steps to prevent the virus from spreading to others.  If you think you have a COVID-19 infection, contact your health care provider right away. Tell your health care team that you think you may have a COVID-19 infection.  Stay home. Leave your house only to seek medical care. Do not use public transport.  Do not travel while you are sick.  Wash your hands often with soap and water for 20 seconds. If soap and water are not available, use alcohol-based hand sanitizer.  Stay away from other members of your household. Let healthy household members care for children and pets, if possible. If you have to care for children or pets, wash your hands often and wear a mask. If possible, stay in your own room, separate from others. Use a different bathroom.  Make sure that all people in your household wash their hands well and often.  Cough or sneeze into a tissue or your sleeve or elbow. Do not cough or sneeze into your hand or into the air.  Wear a cloth face covering or face mask. Make sure your mask covers your nose and mouth. Where to find more information  Centers for Disease Control and Prevention: PurpleGadgets.be  World Health Organization: https://www.castaneda.info/ Contact a health care provider if:  You live in or have traveled to an area where COVID-19 is a risk and you have symptoms of the infection.  You have had contact with someone who has COVID-19 and you have symptoms of the infection. Get help right away if:  You have trouble breathing.  You have pain or pressure in your chest.  You have confusion.  You have bluish lips  and fingernails.  You have difficulty waking from sleep.  You have symptoms that get worse. These symptoms may represent a serious problem that is an emergency. Do not wait to see if the symptoms will go away. Get medical help right away. Call your local emergency services (911 in the [place] Do not drive yourself to the hospital. Let the emergency medical personnel know if you think you have COVID-19. Summary  COVID-19 is a respiratory infection that is caused by a virus. It is also known as coronavirus disease or novel coronavirus. It can cause serious infections, such as pneumonia, acute respiratory distress syndrome, acute respiratory failure, or sepsis.  The virus that causes COVID-19  is contagious. This means that it can spread from person to person through droplets from breathing, speaking, singing, coughing, or sneezing.  You are more likely to develop a serious illness if you are 40 years of age or older, have a weak immune system, live in a nursing home, or have chronic disease.  There is no medicine to treat COVID-19. Your health care provider will talk with you about ways to treat your symptoms.  Take steps to protect yourself and others from infection. Wash your hands often and disinfect objects and surfaces that are frequently touched every day. Stay away from people who are sick and wear a mask if you are sick. This information is not intended to replace advice given to you by your health care provider. Make sure you discuss any questions you have with your health care provider. Document Revised: 07/26/2019 Document Reviewed: 11/01/2018 Elsevier Patient Education  [place].

## 2020-06-05 NOTE — Telephone Encounter (Signed)
I connected by phone with Amanda Walters on 06/05/2020 at 1:26 PM to discuss the potential use of a new treatment for mild to moderate COVID-19 viral infection in non-hospitalized patients.  This patient is a 64 y.o. female that meets the FDA criteria for Emergency Use Authorization of COVID monoclonal antibody casirivimab/imdevimab.  Has a (+) direct SARS-CoV-2 viral test result  Has mild or moderate COVID-19   Is NOT hospitalized due to COVID-19  Is within 10 days of symptom onset  Has at least one of the high risk factor(s) for progression to severe COVID-19 and/or hospitalization as defined in EUA.  Specific high risk criteria : Older age (>/= 65 yo); BMI   I have spoken and communicated the following to the patient or parent/caregiver regarding COVID monoclonal antibody treatment:  1. FDA has authorized the emergency use for the treatment of mild to moderate COVID-19 in adults and pediatric patients with positive results of direct SARS-CoV-2 viral testing who are 55 years of age and older weighing at least 40 kg, and who are at high risk for progressing to severe COVID-19 and/or hospitalization.  2. The significant known and potential risks and benefits of COVID monoclonal antibody, and the extent to which such potential risks and benefits are unknown.  3. Information on available alternative treatments and the risks and benefits of those alternatives, including clinical trials.  4. Patients treated with COVID monoclonal antibody should continue to self-isolate and use infection control measures (e.g., wear mask, isolate, social distance, avoid sharing personal items, clean and disinfect "high touch" surfaces, and frequent handwashing) according to CDC guidelines.   5. The patient or parent/caregiver has the option to accept or refuse COVID monoclonal antibody treatment.  After reviewing this information with the patient, The patient agreed to proceed with receiving  casirivimab\imdevimab infusion and will be provided a copy of the Fact sheet prior to receiving the infusion.   [name], NP 06/05/2020 1:26 PM

## 2020-06-06 ENCOUNTER — Ambulatory Visit (HOSPITAL_COMMUNITY)
Admission: RE | Admit: 2020-06-06 | Discharge: 2020-06-06 | Disposition: A | Discharge status: Home / Self Care | Payer: Medicare Other | Source: Ambulatory Visit | Attending: Pulmonary Disease | Admitting: Pulmonary Disease

## 2020-06-06 DIAGNOSIS — Z23 Encounter for immunization: Secondary | ICD-10-CM | POA: Insufficient documentation

## 2020-06-06 DIAGNOSIS — U071 COVID-19: Secondary | ICD-10-CM | POA: Insufficient documentation

## 2020-06-06 MED ORDER — ACETAMINOPHEN 325 MG PO TABS
650.0000 mg | ORAL_TABLET | Freq: Once | ORAL | Status: AC
  Administered 2020-06-06: 650 mg via ORAL
  Filled 2020-06-06: qty 2

## 2020-06-06 MED ORDER — FAMOTIDINE IN NACL 20-0.9 MG/50ML-% IV SOLN: 20.0000 mg | Freq: Once | INTRAVENOUS | Status: DC | PRN

## 2020-06-06 MED ORDER — SODIUM CHLORIDE 0.9 % IV SOLN: INTRAVENOUS | Status: DC | PRN

## 2020-06-06 MED ORDER — METHYLPREDNISOLONE SODIUM SUCC 125 MG IJ SOLR: 125.0000 mg | Freq: Once | INTRAMUSCULAR | Status: DC | PRN

## 2020-06-06 MED ORDER — SODIUM CHLORIDE 0.9 % IV SOLN
1200.0000 mg | Freq: Once | INTRAVENOUS | Status: AC
  Administered 2020-06-06: 1200 mg via INTRAVENOUS
  Filled 2020-06-06: qty 10

## 2020-06-06 MED ORDER — EPINEPHRINE 0.3 MG/0.3ML IJ SOAJ: 0.3000 mg | Freq: Once | INTRAMUSCULAR | Status: DC | PRN

## 2020-06-06 MED ORDER — ALBUTEROL SULFATE HFA 108 (90 BASE) MCG/ACT IN AERS: 2.0000 | INHALATION_SPRAY | Freq: Once | RESPIRATORY_TRACT | Status: DC | PRN

## 2020-06-06 MED ORDER — DIPHENHYDRAMINE HCL 50 MG/ML IJ SOLN: 50.0000 mg | Freq: Once | INTRAMUSCULAR | Status: DC | PRN

## 2020-06-06 NOTE — Progress Notes (Signed)
  Diagnosis: COVID-19  Physician: Patrick Wright, MD  Procedure: Cov[facility] Infusion Clinic Med: casirivimab\imdevimab infusion - Provided patient with casirivimab\imdevimab fact sheet for patients, parents and caregivers prior to infusion.  Complications: No immediate complications noted.  Discharge: Discharged home   Amanda Walters N Amanda Walters 06/06/2020  

## 2020-06-06 NOTE — Discharge Instructions (Signed)

## 2020-06-10 ENCOUNTER — Telehealth: Payer: Medicare Other | Admitting: Physician Assistant

## 2020-06-10 DIAGNOSIS — Z6831 Body mass index (BMI) 31.0-31.9, adult: Secondary | ICD-10-CM | POA: Diagnosis not present

## 2020-06-10 DIAGNOSIS — R5381 Other malaise: Secondary | ICD-10-CM | POA: Diagnosis not present

## 2020-06-10 DIAGNOSIS — N3000 Acute cystitis without hematuria: Secondary | ICD-10-CM | POA: Diagnosis not present

## 2020-06-10 DIAGNOSIS — B9620 Unspecified Escherichia coli [E. coli] as the cause of diseases classified elsewhere: Secondary | ICD-10-CM | POA: Diagnosis not present

## 2020-06-10 DIAGNOSIS — J1282 Pneumonia due to coronavirus disease 2019: Secondary | ICD-10-CM | POA: Diagnosis not present

## 2020-06-10 DIAGNOSIS — Z9981 Dependence on supplemental oxygen: Secondary | ICD-10-CM | POA: Diagnosis not present

## 2020-06-10 DIAGNOSIS — J9600 Acute respiratory failure, unspecified whether with hypoxia or hypercapnia: Secondary | ICD-10-CM | POA: Diagnosis not present

## 2020-06-10 DIAGNOSIS — J90 Pleural effusion, not elsewhere classified: Secondary | ICD-10-CM | POA: Diagnosis not present

## 2020-06-10 DIAGNOSIS — J159 Unspecified bacterial pneumonia: Secondary | ICD-10-CM | POA: Diagnosis not present

## 2020-06-10 DIAGNOSIS — J9601 Acute respiratory failure with hypoxia: Secondary | ICD-10-CM | POA: Diagnosis not present

## 2020-06-10 DIAGNOSIS — R0902 Hypoxemia: Secondary | ICD-10-CM | POA: Diagnosis not present

## 2020-06-10 DIAGNOSIS — Z79899 Other long term (current) drug therapy: Secondary | ICD-10-CM | POA: Diagnosis not present

## 2020-06-10 DIAGNOSIS — U071 COVID-19: Secondary | ICD-10-CM | POA: Diagnosis not present

## 2020-06-10 DIAGNOSIS — J189 Pneumonia, unspecified organism: Secondary | ICD-10-CM | POA: Diagnosis not present

## 2020-06-10 DIAGNOSIS — E669 Obesity, unspecified: Secondary | ICD-10-CM | POA: Diagnosis not present

## 2020-06-10 NOTE — Progress Notes (Signed)
Called 4 times with no answer.

## 2020-06-12 DIAGNOSIS — U071 COVID-19: Secondary | ICD-10-CM | POA: Diagnosis not present

## 2020-06-16 ENCOUNTER — Encounter: Payer: Self-pay | Admitting: Osteopathic Medicine

## 2020-07-13 ENCOUNTER — Telehealth (INDEPENDENT_AMBULATORY_CARE_PROVIDER_SITE_OTHER): Payer: PPO | Admitting: Osteopathic Medicine

## 2020-07-13 DIAGNOSIS — Z09 Encounter for follow-up examination after completed treatment for conditions other than malignant neoplasm: Secondary | ICD-10-CM

## 2020-07-13 DIAGNOSIS — U071 COVID-19: Secondary | ICD-10-CM

## 2020-07-13 DIAGNOSIS — Z8616 Personal history of COVID-19: Secondary | ICD-10-CM

## 2020-07-13 NOTE — Progress Notes (Signed)
Virtual Visit via phone  I connected with      Amanda Walters on 07/13/20 at 9:55 AM  by a telemedicine application and verified that I am speaking with the correct person using two identifiers.  Patient is at home I am in office   I discussed the limitations of evaluation and management by telemedicine and the availability of in person appointments. The patient expressed understanding and agreed to proceed.  History of Present Illness: Amanda Walters is a 64 y.o. female who would like to discuss COVID follow-uip   Hospitalized at [facility], no records available   She called 911 d/t SOB on 06/10/2020, went via EMS, kept 11 days, dx pneumonia, treated w/ steroids, antibiotics, antivirals.   No longer needing O2 SaO2 today 98 on RA BP 130/75  Feeling more tired than usual, napping more  No SOB or CP Occasional coughing       Observations/Objective: There were no vitals taken for this visit. BP Readings from Last 3 Encounters:  06/06/20 (!) 94/57  06/05/20 115/65  06/01/20 (!) 141/69   Exam: Normal Speech.  NAD  Lab and Radiology Results No results found for this or any previous visit (from the past 72 hour(s)). No results found.     Assessment and Plan: 64 y.o. female with The encounter diagnosis was COVID-19, hospitalized 06/10/2020 - 06/21/2020 with pneumonia. Unvaccinated. Recovering well as of 07/13/20.  Recommended vaccination, advised repeat infections are possible, concern over future viral mutations/variants and unpredictability of these. ER precautions reviewed re: symptoms of PNA, VTE, pericarditis, other potential complications of COVID illness post-infection. All questions answered. Patient declined home health physical therapy, we discussed slowly increasing exercise to improve endurance, call me if any questions/concerns. Will request records      Follow Up Instructions: Return if symptoms worsen or fail to improve.    I discussed the  assessment and treatment plan with the patient. The patient was provided an opportunity to ask questions and all were answered. The patient agreed with the plan and demonstrated an understanding of the instructions.   The patient was advised to call back or seek an in-person evaluation if any new concerns, if symptoms worsen or if the condition fails to improve as anticipated.  30 minutes of non-face-to-face time was provided during this encounter.      . . . . . . . . . . . . . Marland Kitchen                   Historical information moved to improve visibility of documentation.  No past medical history on file. Past Surgical History:  Procedure Laterality Date  . ABDOMINAL HYSTERECTOMY  1993  . CHOLECYSTECTOMY    . HERNIA REPAIR     ventral hernia   . KNEE ARTHROSCOPY  2006  . KNEE ARTHROSCOPY  2012  . TOTAL KNEE ARTHROPLASTY  2011   Social History   Tobacco Use  . Smoking status: Never Smoker  . Smokeless tobacco: Never Used  Substance Use Topics  . Alcohol use: Not Currently   family history includes Heart attack in an other family member.  Medications: Current Outpatient Medications  Medication Sig Dispense Refill  . ascorbic acid (VITAMIN C) 500 MG tablet Vitamin C 500 mg tablet   2 tablets every day by oral route.    . benzonatate (TESSALON) 200 MG capsule Take 1 capsule (200 mg total) by mouth 2 (two) times daily as needed for cough. [place]  capsule 0  . estradiol (ESTRACE) 1 MG tablet     . Loratadine 10 MG CAPS Take by mouth.    . Misc Natural Products (COSAMIN ASU ADVANCED FORMULA PO) Take by mouth.    . Multiple Vitamins-Minerals (MULTIVITAMIN PO) Take by mouth.    Marland Kitchen omeprazole (PRILOSEC) 20 MG capsule Take 20 mg by mouth daily.    . vitamin B-12 (CYANOCOBALAMIN) 1000 MCG tablet Take 1,000 mcg by mouth daily.     No current facility-administered medications for this visit.   Allergies  Allergen Reactions  . Morphine And Related Itching  .  Sulfa Antibiotics Hives  . Morphine Hives  . Cephalexin Other (See Comments)    "funny feeling"

## 2020-07-20 DIAGNOSIS — U071 COVID-19: Secondary | ICD-10-CM | POA: Diagnosis not present

## 2020-08-05 DIAGNOSIS — M25552 Pain in left hip: Secondary | ICD-10-CM | POA: Diagnosis not present

## 2020-08-05 DIAGNOSIS — M1712 Unilateral primary osteoarthritis, left knee: Secondary | ICD-10-CM | POA: Diagnosis not present

## 2020-08-10 ENCOUNTER — Ambulatory Visit (INDEPENDENT_AMBULATORY_CARE_PROVIDER_SITE_OTHER): Payer: PPO

## 2020-08-10 ENCOUNTER — Ambulatory Visit (INDEPENDENT_AMBULATORY_CARE_PROVIDER_SITE_OTHER): Payer: PPO | Admitting: Nurse Practitioner

## 2020-08-10 ENCOUNTER — Other Ambulatory Visit: Payer: Self-pay

## 2020-08-10 ENCOUNTER — Encounter: Payer: Self-pay | Admitting: Nurse Practitioner

## 2020-08-10 VITALS — BP 112/74 | HR 78 | Ht 64.0 in | Wt 183.0 lb

## 2020-08-10 DIAGNOSIS — R059 Cough, unspecified: Secondary | ICD-10-CM | POA: Diagnosis not present

## 2020-08-10 DIAGNOSIS — J984 Other disorders of lung: Secondary | ICD-10-CM

## 2020-08-10 DIAGNOSIS — U099 Post covid-19 condition, unspecified: Secondary | ICD-10-CM | POA: Insufficient documentation

## 2020-08-10 DIAGNOSIS — J189 Pneumonia, unspecified organism: Secondary | ICD-10-CM | POA: Diagnosis not present

## 2020-08-10 DIAGNOSIS — R053 Chronic cough: Secondary | ICD-10-CM

## 2020-08-10 DIAGNOSIS — J90 Pleural effusion, not elsewhere classified: Secondary | ICD-10-CM | POA: Diagnosis not present

## 2020-08-10 DIAGNOSIS — R0600 Dyspnea, unspecified: Secondary | ICD-10-CM | POA: Diagnosis not present

## 2020-08-10 NOTE — Progress Notes (Signed)
Acute Office Visit  Subjective:    Patient ID: Amanda Walters, female    DOB: 1956-08-24, 64 y.o.   MRN: [id number]  COVID-19 Follow-up  HPI Amanda Walters is a 64 year old female presenting today for evaluation after COVID-19 infection approximately 2 months ago. She reports that she is overall doing fairly well after her COVID-19 infection. She feels that she is recovering slowly, but is working to not push herself too much. She has not returned to her baseline activities of swimming 3-4 days a week, but plans to do that in the future once her strength is back.   She tells me that she was hospitalized for 11 days with the virus and received IV Remdesivir, steroids, and antibiotics for COVID pneumonia.  She has not been vaccinated against COVID-19 and is still unsure if she plans to get the vaccine.   She does have some concerns today with her lung function since having COVID. She tells me that she does still experience some dyspnea with exertion and must take frequent rest periods. She is also still dealing with an ongoing cough from the infection. She also endorses ongoing loss of taste and weakness.   She denies shortness of breath at rest, dizziness, congestion, loss of smell, fevers, or productive cough. She has not had to use the in-home oxygen since about 5 days after discharge and she reports that she has not had to use her inhaler.   She does express concern today about scarring in the lungs and prolonged lung damage from the virus.   Past Medical History:  Diagnosis Date  . COVID-19 06/05/2020  . Fever due to COVID-19 06/05/2020    Past Surgical History:  Procedure Laterality Date  . ABDOMINAL HYSTERECTOMY  1993  . CHOLECYSTECTOMY    . HERNIA REPAIR     ventral hernia   . KNEE ARTHROSCOPY  2006  . KNEE ARTHROSCOPY  2012  . TOTAL KNEE ARTHROPLASTY  2011    Family History  Problem Relation Age of Onset  . Heart attack Other        mother     Social History    Socioeconomic History  . Marital status: Single    Spouse name: Not on file  . Number of children: Not on file  . Years of education: Not on file  . Highest education level: Not on file  Occupational History  . Occupation: EC Teacher  Tobacco Use  . Smoking status: Never Smoker  . Smokeless tobacco: Never Used  Vaping Use  . Vaping Use: Never used  Substance and Sexual Activity  . Alcohol use: Not Currently  . Drug use: Never  . Sexual activity: Not Currently  Other Topics Concern  . Not on file  Social History Narrative  . Not on file   Social Determinants of Health   Financial Resource Strain:   . Difficulty of Paying Living Expenses: Not on file  Food Insecurity:   . Worried About Charity fundraiser in the Last Year: Not on file  . Ran Out of Food in the Last Year: Not on file  Transportation Needs:   . Lack of Transportation (Medical): Not on file  . Lack of Transportation (Non-Medical): Not on file  Physical Activity:   . Days of Exercise per Week: Not on file  . Minutes of Exercise per Session: Not on file  Stress:   . Feeling of Stress : Not on file  Social Connections:   . Frequency  of Communication with Friends and Family: Not on file  . Frequency of Social Gatherings with Friends and Family: Not on file  . Attends Religious Services: Not on file  . Active Member of Clubs or Organizations: Not on file  . Attends Archivist Meetings: Not on file  . Marital Status: Not on file  Intimate Partner Violence:   . Fear of Current or Ex-Partner: Not on file  . Emotionally Abused: Not on file  . Physically Abused: Not on file  . Sexually Abused: Not on file    Outpatient Medications Prior to Visit  Medication Sig Dispense Refill  . ascorbic acid (VITAMIN C) 500 MG tablet Vitamin C 500 mg tablet   2 tablets every day by oral route.    Marland Kitchen estradiol (ESTRACE) 1 MG tablet     . Loratadine 10 MG CAPS Take by mouth.    . Misc Natural Products (COSAMIN  ASU ADVANCED FORMULA PO) Take by mouth.    . Multiple Vitamins-Minerals (MULTIVITAMIN PO) Take by mouth.    Marland Kitchen omeprazole (PRILOSEC) 20 MG capsule Take 20 mg by mouth daily.    . vitamin B-12 (CYANOCOBALAMIN) 1000 MCG tablet Take 1,000 mcg by mouth daily.    . benzonatate (TESSALON) 200 MG capsule Take 1 capsule (200 mg total) by mouth 2 (two) times daily as needed for cough. 20 capsule 0   No facility-administered medications prior to visit.    Allergies  Allergen Reactions  . Morphine And Related Itching  . Sulfa Antibiotics Hives  . Morphine Hives  . Cephalexin Other (See Comments)    "funny feeling"    Review of Systems All review of systems negative except what is listed in the HPI     Objective:    Physical Exam Vitals and nursing note reviewed.  Constitutional:      Appearance: Normal appearance.  HENT:     Head: Normocephalic.     Right Ear: Tympanic membrane normal.     Left Ear: Tympanic membrane normal.  Eyes:     Extraocular Movements: Extraocular movements intact.     Conjunctiva/sclera: Conjunctivae normal.     Pupils: Pupils are equal, round, and reactive to light.  Neck:     Vascular: No carotid bruit.  Cardiovascular:     Rate and Rhythm: Normal rate and regular rhythm.     Pulses: Normal pulses.     Heart sounds: Normal heart sounds.  Pulmonary:     Effort: Pulmonary effort is normal.     Breath sounds: Normal breath sounds.     Comments: Mild dyspnea and cough noted with exertion. O2 saturations maintained with movement.  Abdominal:     General: Abdomen is flat. Bowel sounds are normal.     Palpations: Abdomen is soft.  Musculoskeletal:        General: Normal range of motion.     Cervical back: Normal range of motion.     Right lower leg: No edema.     Left lower leg: No edema.  Lymphadenopathy:     Cervical: No cervical adenopathy.  Skin:    General: Skin is warm and dry.     Capillary Refill: Capillary refill takes less than 2 seconds.   Neurological:     General: No focal deficit present.     Mental Status: She is alert and oriented to person, place, and time.  Psychiatric:        Mood and Affect: Mood normal.  Behavior: Behavior normal.        Thought Content: Thought content normal.        Judgment: Judgment normal.     BP 112/74   Pulse 78   Ht 5\' 4"  (1.626 m)   Wt 183 lb (83 kg)   SpO2 96%   BMI 31.41 kg/m  Wt Readings from Last 3 Encounters:  08/10/20 183 lb (83 kg)  06/01/20 190 lb (86.2 kg)  02/26/20 190 lb (86.2 kg)    Health Maintenance Due  Topic Date Due  . COVID-19 Vaccine (1) Never done  . DEXA SCAN  Never done  . PNA vac Low Risk Adult (1 of 2 - PCV13) Never done  . MAMMOGRAM  05/10/2020    There are no preventive care reminders to display for this patient.   Lab Results  Component Value Date   TSH 4.50 11/25/2019   Lab Results  Component Value Date   WBC 7.0 11/25/2019   HGB 14.3 11/25/2019   HCT 42.4 11/25/2019   MCV 89.8 11/25/2019   PLT 252 11/25/2019   Lab Results  Component Value Date   NA 139 11/25/2019   K 4.4 11/25/2019   CO2 26 11/25/2019   GLUCOSE 79 11/25/2019   BUN 16 11/25/2019   CREATININE 0.89 11/25/2019   BILITOT 0.3 11/25/2019   ALKPHOS 73 02/18/2010   AST 22 11/25/2019   ALT 20 11/25/2019   PROT 6.9 11/25/2019   ALBUMIN 3.7 02/18/2010   CALCIUM 9.8 11/25/2019   Lab Results  Component Value Date   CHOL 196 11/25/2019   Lab Results  Component Value Date   HDL 49 (L) 11/25/2019   Lab Results  Component Value Date   LDLCALC 111 (H) 11/25/2019   Lab Results  Component Value Date   TRIG 250 (H) 11/25/2019   Lab Results  Component Value Date   CHOLHDL 4.0 11/25/2019   No results found for: HGBA1C     Assessment & Plan:   Problem List Items Addressed This Visit      Other   Post-COVID chronic cough - Primary    Symptoms and presentation consistent with post viral residual cough and shortness of breath with exertion.   Discussed that COVID-19 symptoms can continue for several months after the infection, but the fact that she is progressing is promising.  Will plan to obtain chest x-ray today to evaluate for residual lung scarring or infiltrates given the ongoing symptoms.  Discussed with patient that pulmonary function tests may also be warranted based on her recovery and the results of the chest x-ray. She would like to wait to see what the cxr shows today before further evaluation of this.  Will notify patient of results and make changes to the plan of care as appropriate based on cxr findings.  If CXR shows residual findings, or if her dyspnea/cough continue, will plan for pulmonology referral for further evaluation.       Relevant Orders   DG Chest 2 View      Follow-up if symptoms worsen or fail to improve- will make changes to plan of care as necessary based on results of imaging.   Orma Render, NP

## 2020-08-10 NOTE — Assessment & Plan Note (Signed)
Symptoms and presentation consistent with post viral residual cough and shortness of breath with exertion.  Discussed that COVID-19 symptoms can continue for several months after the infection, but the fact that she is progressing is promising.  Will plan to obtain chest x-ray today to evaluate for residual lung scarring or infiltrates given the ongoing symptoms.  Discussed with patient that pulmonary function tests may also be warranted based on her recovery and the results of the chest x-ray. She would like to wait to see what the cxr shows today before further evaluation of this.  Will notify patient of results and make changes to the plan of care as appropriate based on cxr findings.  If CXR shows residual findings, or if her dyspnea/cough continue, will plan for pulmonology referral for further evaluation.

## 2020-08-10 NOTE — Patient Instructions (Signed)
What You Should Know About COVID-19 to Protect Yourself and Others Know about COVID-19  Coronavirus (COVID-19) is an illness caused by a virus that can spread from person to person.  The virus that causes COVID-19 is a new coronavirus that has spread throughout the world.  COVID-19 symptoms can range from mild (or no symptoms) to severe illness. Know how COVID-19 is spread  You can become infected by coming into close contact (about 6 feet or two arm lengths) with a person who has COVID-19. COVID-19 is primarily spread from person to person.  You can become infected from respiratory droplets when an infected person coughs, sneezes, or talks.  You may also be able to get it by touching a surface or object that has the virus on it, and then by touching your mouth, nose, or eyes. Protect yourself and others from COVID-19  There is currently no vaccine to protect against COVID-19. The best way to protect yourself is to avoid being exposed to the virus that causes COVID-19.  Stay home as much as possible and avoid close contact with others.  Wear a mask that covers your nose and mouth in public settings.  Clean and disinfect frequently touched surfaces.  Wash your hands often with soap and water for at least 20 seconds, or use an alcohol-based hand sanitizer that contains at least 60% alcohol. Practice social distancing  Buy groceries and medicine, go to the doctor, and complete banking activities online when possible.  If you must go in person, stay at least 6 feet away from others and disinfect items you must touch.  Get deliveries and takeout, and limit in-person contact as much as possible. Prevent the spread of COVID-19 if you are sick  Stay home if you are sick, except to get medical care.  Avoid public transportation, ride-sharing, or taxis.  Separate yourself from other people and pets in your home.  There is no specific treatment for COVID-19, but you can seek medical  care to help relieve your symptoms.  If you need medical attention, call ahead. Know your risk for severe illness  Everyone is at risk of getting COVID-19.  Older adults and people of any age who have serious underlying medical conditions may be at higher risk for more severe illness. [email or web address] 03/11/2019 This information is not intended to replace advice given to you by your health care provider. Make sure you discuss any questions you have with your health care provider. Document Revised: 09/12/2019 Document Reviewed: 09/12/2019 Elsevier Patient Education  [employer].

## 2020-08-12 ENCOUNTER — Other Ambulatory Visit: Payer: Self-pay | Admitting: Nurse Practitioner

## 2020-08-12 DIAGNOSIS — U099 Post covid-19 condition, unspecified: Secondary | ICD-10-CM

## 2020-08-12 DIAGNOSIS — R918 Other nonspecific abnormal finding of lung field: Secondary | ICD-10-CM | POA: Insufficient documentation

## 2020-08-12 NOTE — Progress Notes (Signed)
Results came in this morning- so sorry for the delay!  Your lungs do show significant improvement from the previous x-rays, but there is evidence of possible post-infectious scarring or a small amount of residual pneumonia.   Given that you are not having fevers or other symptoms of pneumonia, my suspicion is that this is residual scarring from COVID.   I can place a pulmonology referral for you for further evaluation and recommendations. Do you have a preferred location or specific physician in mind?

## 2020-08-19 DIAGNOSIS — Z01419 Encounter for gynecological examination (general) (routine) without abnormal findings: Secondary | ICD-10-CM | POA: Diagnosis not present

## 2020-08-19 DIAGNOSIS — Z1231 Encounter for screening mammogram for malignant neoplasm of breast: Secondary | ICD-10-CM | POA: Diagnosis not present

## 2020-09-14 ENCOUNTER — Ambulatory Visit (INDEPENDENT_AMBULATORY_CARE_PROVIDER_SITE_OTHER): Payer: PPO | Admitting: Pulmonary Disease

## 2020-09-14 ENCOUNTER — Other Ambulatory Visit: Payer: Self-pay

## 2020-09-14 ENCOUNTER — Encounter: Payer: Self-pay | Admitting: Pulmonary Disease

## 2020-09-14 VITALS — BP 124/70 | HR 88 | Temp 97.9°F | Ht 64.0 in | Wt 187.0 lb

## 2020-09-14 DIAGNOSIS — R053 Chronic cough: Secondary | ICD-10-CM

## 2020-09-14 DIAGNOSIS — U099 Post covid-19 condition, unspecified: Secondary | ICD-10-CM

## 2020-09-14 DIAGNOSIS — Z23 Encounter for immunization: Secondary | ICD-10-CM

## 2020-09-14 DIAGNOSIS — U071 COVID-19: Secondary | ICD-10-CM | POA: Diagnosis not present

## 2020-09-14 DIAGNOSIS — J1282 Pneumonia due to coronavirus disease 2019: Secondary | ICD-10-CM | POA: Diagnosis not present

## 2020-09-14 NOTE — Patient Instructions (Signed)
Use your albuterol inhaler 1-2 puffs as needed every 4-6 hours for shortness of breath, cough or wheezing.   Follow up in 3 months if symptoms persist. May call and cancel appointment if feeling back to baseline. We can consider checking CT Chest scan and pulmonary function tests in the future to assess any permanent issues from the covid pneumonia.

## 2020-09-14 NOTE — Progress Notes (Signed)
Synopsis: Referred by Jacolyn Reedy, NP for cough, post-covid 19 pneumonia  Subjective:   PATIENT ID: Amanda Walters GENDER: female DOB: [birth year], MRN: [id number]   HPI  Chief Complaint  Patient presents with  . Consult    post covid (hospitalized) 06/10/20 cough with clear mucus, abn cxr.  tired   Kyrah Schiro is a 64 year old woman, never smoker who has been referred to pulmonary clinic for post-covid 19 pneumonia symptoms.   She was hospitalized with covid in early 06/2020. She continues to experience fatigue/tiredness with exertional activity and occasional cough. She denies feeling short of breath with activity, mainly the fatigue. The cough is intermittent and rarely productive with clear sputum. Overall, she reports she is slowly improving since her hospitalization. She has been prescribed an albuterol inhaler which she has not tried yet.   She denies sinus congestion or drainage and denies heartburn/reflux symptoms as she takes omeprazole daily. She denies wheezing. She denies leg swelling. She has no trouble sleeping at night. She denies PND, night time awakenings due to cough or shortness of breath. She feels rested in the mornings.    Chest radiograph from 06/10/20 when compared to 08/10/20, shows improvement in bilateral patchy opacities.   Past Medical History:  Diagnosis Date  . COVID-19 06/05/2020  . Fever due to COVID-19 06/05/2020     Family History  Problem Relation Age of Onset  . Heart attack Other        mother      Social History   Socioeconomic History  . Marital status: Single    Spouse name: Not on file  . Number of children: Not on file  . Years of education: Not on file  . Highest education level: Not on file  Occupational History  . Occupation: EC Teacher  Tobacco Use  . Smoking status: Never Smoker  . Smokeless tobacco: Never Used  Vaping Use  . Vaping Use: Never used  Substance and Sexual Activity  . Alcohol use: Not Currently  . Drug use:  Never  . Sexual activity: Not Currently  Other Topics Concern  . Not on file  Social History Narrative  . Not on file   Social Determinants of Health   Financial Resource Strain:   . Difficulty of Paying Living Expenses: Not on file  Food Insecurity:   . Worried About Charity fundraiser in the Last Year: Not on file  . Ran Out of Food in the Last Year: Not on file  Transportation Needs:   . Lack of Transportation (Medical): Not on file  . Lack of Transportation (Non-Medical): Not on file  Physical Activity:   . Days of Exercise per Week: Not on file  . Minutes of Exercise per Session: Not on file  Stress:   . Feeling of Stress : Not on file  Social Connections:   . Frequency of Communication with Friends and Family: Not on file  . Frequency of Social Gatherings with Friends and Family: Not on file  . Attends Religious Services: Not on file  . Active Member of Clubs or Organizations: Not on file  . Attends Archivist Meetings: Not on file  . Marital Status: Not on file  Intimate Partner Violence:   . Fear of Current or Ex-Partner: Not on file  . Emotionally Abused: Not on file  . Physically Abused: Not on file  . Sexually Abused: Not on file     Allergies  Allergen Reactions  . Morphine  And Related Itching  . Sulfa Antibiotics Hives  . Morphine Hives  . Cephalexin Other (See Comments)    "funny feeling"     Outpatient Medications Prior to Visit  Medication Sig Dispense Refill  . ascorbic acid (VITAMIN C) 500 MG tablet Vitamin C 500 mg tablet   2 tablets every day by oral route.    Marland Kitchen estradiol (ESTRACE) 1 MG tablet     . Loratadine 10 MG CAPS Take by mouth.    . Misc Natural Products (COSAMIN ASU ADVANCED FORMULA PO) Take by mouth.    . Multiple Vitamins-Minerals (MULTIVITAMIN PO) Take by mouth.    Marland Kitchen omeprazole (PRILOSEC) 20 MG capsule Take 20 mg by mouth daily.    . vitamin B-12 (CYANOCOBALAMIN) 1000 MCG tablet Take 1,000 mcg by mouth daily.     No  facility-administered medications prior to visit.    Review of Systems  Constitutional: Positive for malaise/fatigue. Negative for chills, fever and weight loss.  HENT: Negative for congestion, sinus pain and sore throat.   Eyes: Negative.   Respiratory: Positive for cough and sputum production. Negative for hemoptysis, shortness of breath and wheezing.   Cardiovascular: Negative for chest pain, palpitations, leg swelling and PND.  Gastrointestinal: Negative for abdominal pain, heartburn and nausea.  Genitourinary: Negative.   Musculoskeletal: Positive for joint pain (foot and knee).  Skin: Negative.   Neurological: Negative for dizziness, weakness and headaches.  Endo/Heme/Allergies: Negative.   Psychiatric/Behavioral: Negative.    Objective:   Vitals:   09/14/20 1004  BP: 124/70  Pulse: 88  Temp: 97.9 F (36.6 C)  TempSrc: Temporal  SpO2: 96%  Weight: 187 lb (84.8 kg)  Height: 5\' 4"  (1.626 m)    Physical Exam Constitutional:      General: She is not in acute distress.    Appearance: Normal appearance. She is not ill-appearing.  HENT:     Head: Normocephalic and atraumatic.     Nose: Nose normal.     Mouth/Throat:     Mouth: Mucous membranes are moist.     Pharynx: Oropharynx is clear.  Eyes:     General: No scleral icterus.    Conjunctiva/sclera: Conjunctivae normal.     Pupils: Pupils are equal, round, and reactive to light.  Cardiovascular:     Rate and Rhythm: Normal rate and regular rhythm.     Pulses: Normal pulses.     Heart sounds: Normal heart sounds.  Pulmonary:     Effort: Pulmonary effort is normal.     Breath sounds: Rales (right mid-lung field) present. No wheezing or rhonchi.  Abdominal:     General: Bowel sounds are normal.     Palpations: Abdomen is soft.  Musculoskeletal:     Cervical back: Neck supple.     Right lower leg: No edema.     Left lower leg: No edema.  Skin:    General: Skin is warm and dry.     Capillary Refill: Capillary  refill takes less than 2 seconds.  Neurological:     General: No focal deficit present.     Mental Status: She is alert.  Psychiatric:        Mood and Affect: Mood normal.        Behavior: Behavior normal.        Thought Content: Thought content normal.        Judgment: Judgment normal.    CBC    Component Value Date/Time   WBC 7.0 11/25/2019 1033  RBC 4.72 11/25/2019 1033   HGB 14.3 11/25/2019 1033   HCT 42.4 11/25/2019 1033   PLT 252 11/25/2019 1033   MCV 89.8 11/25/2019 1033   MCH 30.3 11/25/2019 1033   MCHC 33.7 11/25/2019 1033   RDW 12.4 11/25/2019 1033   LYMPHSABS 2,030 11/25/2019 1033   MONOABS 0.6 02/18/2010 1000   EOSABS 490 11/25/2019 1033   BASOSABS 70 11/25/2019 1033   BMP Latest Ref Rng & Units 11/25/2019 11/06/2017 02/28/2010  Glucose 65 - 139 mg/dL 79 131(H) 125(H)  BUN 7 - 25 mg/dL 16 14 6   Creatinine 0.50 - 0.99 mg/dL 0.89 1.12(H) 0.84  BUN/Creat Ratio 6 - 22 (calc) NOT APPLICABLE 13 -  Sodium 135 - 146 mmol/L 139 138 138  Potassium 3.5 - 5.3 mmol/L 4.4 4.1 4.0  Chloride 98 - 110 mmol/L 104 104 110  CO2 20 - 32 mmol/L 26 25 24   Calcium 8.6 - 10.4 mg/dL 9.8 9.5 8.1(L)   Chest imaging: CXR 08/10/20 The heart size and mediastinal contours are within normal limits. No pneumothorax or pleural effusion is noted. Multiple ill-defined opacities are noted throughout both lungs which are significantly decreased compared to prior exam, most consistent with residual multifocal pneumonia or postinfectious scarring. The visualized skeletal structures are unremarkable.  PFT: No flowsheet data found.  Labs: Reviewed as above  Echo: No echo on file  Heart Catheterization: No heart cath on file  Assessment & Plan:   Pneumonia due to COVID-19 virus  Post-COVID chronic cough  Discussion: Kataleah Bejar is a 64 year old woman, never smoker who has been referred to pulmonary clinic for post-covid 19 pneumonia symptoms.   She continues to experience  exertional fatigue and intermittent cough, but is otherwise feeling much better since her initial infection. She is not limited from her daily activities due to her respiratory status. On ambulatory oxygen monitoring, she maintained an oxygen saturation between 90-92% with a brief drop to 85-87% that quickly rebounded back into the 90s and was not sustained. She does not require supplemental oxygen at this time.   We discussed that the fatigue may take months to improve along with the shortness of breath. She is to trial her albuterol inhaler for shortness of breath and monitor for improvement. Her chest imaging has already shown great improvement so I do not feel the need to at an inhaled steroid inhaler at this time.   Overall she is recovering well from her recent infection. We will have her follow up in 3 months if the symptoms remain and consider further testing with CT chest and pulmonary function tests.  Freda Jackson, MD [facility] Office: [phone]   See Amion for Pager Details    Current Outpatient Medications:  .  ascorbic acid (VITAMIN C) 500 MG tablet, Vitamin C 500 mg tablet   2 tablets every day by oral route., Disp: , Rfl:  .  estradiol (ESTRACE) 1 MG tablet, , Disp: , Rfl:  .  Loratadine 10 MG CAPS, Take by mouth., Disp: , Rfl:  .  Misc Natural Products (COSAMIN ASU ADVANCED FORMULA PO), Take by mouth., Disp: , Rfl:  .  Multiple Vitamins-Minerals (MULTIVITAMIN PO), Take by mouth., Disp: , Rfl:  .  omeprazole (PRILOSEC) 20 MG capsule, Take 20 mg by mouth daily., Disp: , Rfl:  .  vitamin B-12 (CYANOCOBALAMIN) 1000 MCG tablet, Take 1,000 mcg by mouth daily., Disp: , Rfl:

## 2020-09-18 ENCOUNTER — Telehealth: Payer: Self-pay

## 2020-09-18 NOTE — Telephone Encounter (Signed)
Amanda Walters is post COVID and is having mild chest discomfort for a week. Denies shortness of breath. I advised patient to go to the urgent care for work up. She refused to go. She state she feels like it is heart burn. Or muscle pain from all the coughing she did with COVID. She will only come to our office. She has been scheduled for Monday. We do not have appointments today.

## 2020-09-21 ENCOUNTER — Other Ambulatory Visit: Payer: Self-pay

## 2020-09-21 ENCOUNTER — Encounter: Payer: Self-pay | Admitting: Nurse Practitioner

## 2020-09-21 ENCOUNTER — Ambulatory Visit (INDEPENDENT_AMBULATORY_CARE_PROVIDER_SITE_OTHER): Payer: PPO | Admitting: Nurse Practitioner

## 2020-09-21 VITALS — BP 126/73 | HR 72 | Temp 98.1°F | Ht 64.0 in | Wt 187.7 lb

## 2020-09-21 DIAGNOSIS — U099 Post covid-19 condition, unspecified: Secondary | ICD-10-CM | POA: Diagnosis not present

## 2020-09-21 NOTE — Progress Notes (Signed)
Acute Office Visit  Subjective:    Patient ID: Amanda Walters, female    DOB: Jun 26, 1956, 64 y.o.   MRN: [id number]  Chief Complaint  Patient presents with  . Chest Pain    Chest discomfort, post COVID, right side of sternum and in her back, tightness, dull    HPI Patient is in today for "lung pain" 3-4/10 right now, but 6-7/10 at its worst since returning home from the hospital from [place] in late September of this year.   She reports the pain is located to the right of the sternum area at approximately the 6th intercostal space. She reports occasionally it will radiate through to her back. It is dull and tight character.   She tells me that the pain comes and goes and does not seem to be associated with eating, activity, coughing, using her arms, or deep breathing. She is still able to carry out her activities and works with her upper extremities Manufacturing systems engineer.   She denies the pain keeping her awake at night, but she does occasionally wake with the sensation. It is not worse when laying back or relieved with leaning forward. She denies nausea, vomiting, palpitations, left arm pain, jaw pain, or dizziness.   She does endorse ongoing fatigue from COVID, but she tells me that she paces her activities. She is coughing less than before and tells me she is producing a clear to white sputum. She tells me that sometimes deep breathing can exacerbate the pain. She is unclear if cold weather has any effect on the pain.   She was seen by pulmonology last Monday for residual lung effects of COVID and had a chest x-ray which does show the presence of infiltrates and some post infectious scarring. He encouraged use of the albuterol inhaler, which she tells me she has not used.  She does heat her home with a wood stove, but does not feel this exacerbates the pain. She is a non-smoker. Her blood pressure is well controlled.   She tells me while in the hospital for [place] ([facility]) she did have multiple  EKG's which were all normal. She also tells me her mother died of a MI at age 65.   Past Medical History:  Diagnosis Date  . COVID-19 06/05/2020  . Fever due to COVID-19 06/05/2020    Past Surgical History:  Procedure Laterality Date  . ABDOMINAL HYSTERECTOMY  1993  . CHOLECYSTECTOMY    . HERNIA REPAIR     ventral hernia   . KNEE ARTHROSCOPY  2006  . KNEE ARTHROSCOPY  2012  . TOTAL KNEE ARTHROPLASTY  2011    Family History  Problem Relation Age of Onset  . Heart attack Other        mother     Social History   Socioeconomic History  . Marital status: Single    Spouse name: Not on file  . Number of children: Not on file  . Years of education: Not on file  . Highest education level: Not on file  Occupational History  . Occupation: EC Teacher  Tobacco Use  . Smoking status: Never Smoker  . Smokeless tobacco: Never Used  Vaping Use  . Vaping Use: Never used  Substance and Sexual Activity  . Alcohol use: Not Currently  . Drug use: Never  . Sexual activity: Not Currently  Other Topics Concern  . Not on file  Social History Narrative  . Not on file   Social Determinants of Health  Financial Resource Strain: Not on file  Food Insecurity: Not on file  Transportation Needs: Not on file  Physical Activity: Not on file  Stress: Not on file  Social Connections: Not on file  Intimate Partner Violence: Not on file    Outpatient Medications Prior to Visit  Medication Sig Dispense Refill  . albuterol (VENTOLIN HFA) 108 (90 Base) MCG/ACT inhaler Inhale 2 puffs into the lungs every 6 (six) hours as needed.    Marland Kitchen ascorbic acid (VITAMIN C) 500 MG tablet Vitamin C 500 mg tablet   2 tablets every day by oral route.    Marland Kitchen estradiol (ESTRACE) 1 MG tablet     . Loratadine 10 MG CAPS Take by mouth.    Marland Kitchen MAGNESIUM PO Take by mouth daily.    . meloxicam (MOBIC) 15 MG tablet Take 15 mg by mouth daily.    . Misc Natural Products (COSAMIN ASU ADVANCED FORMULA PO) Take by mouth.    .  Multiple Vitamins-Minerals (MULTIVITAMIN PO) Take by mouth.    . Multiple Vitamins-Minerals (ZINC PO) Take by mouth daily.    Marland Kitchen omeprazole (PRILOSEC) 20 MG capsule Take 20 mg by mouth daily.    . vitamin B-12 (CYANOCOBALAMIN) 1000 MCG tablet Take 1,000 mcg by mouth daily.     No facility-administered medications prior to visit.    Allergies  Allergen Reactions  . Morphine And Related Itching  . Sulfa Antibiotics Hives  . Morphine Hives  . Cephalexin Other (See Comments)    "funny feeling"    Review of Systems All review of systems negative except what is listed in the HPI     Objective:    Physical Exam Vitals and nursing note reviewed.  Constitutional:      General: She is not in acute distress.    Appearance: She is well-developed. She is not ill-appearing or diaphoretic.  HENT:     Head: Normocephalic.  Eyes:     Extraocular Movements: Extraocular movements intact.     Pupils: Pupils are equal, round, and reactive to light.  Neck:     Vascular: No hepatojugular reflux or JVD.  Cardiovascular:     Rate and Rhythm: Normal rate and regular rhythm.  No extrasystoles are present.    Chest Wall: PMI is not displaced.     Pulses:          Carotid pulses are 2+ on the right side and 2+ on the left side.      Radial pulses are 2+ on the right side and 2+ on the left side.       Dorsalis pedis pulses are 2+ on the right side and 2+ on the left side.       Posterior tibial pulses are 2+ on the right side and 2+ on the left side.     Heart sounds: Normal heart sounds. Heart sounds not distant. No murmur heard. No friction rub. No gallop.   Pulmonary:     Effort: Pulmonary effort is normal. No tachypnea, accessory muscle usage or respiratory distress.     Breath sounds: No stridor. Examination of the right-lower field reveals decreased breath sounds. Decreased breath sounds present. No wheezing, rhonchi or rales.  Chest:     Chest wall: No mass, deformity, tenderness, crepitus  or edema. There is no dullness to percussion.  Abdominal:     General: Bowel sounds are normal. There is no abdominal bruit.     Palpations: Abdomen is soft. There is no hepatomegaly, splenomegaly  or mass.     Tenderness: There is no abdominal tenderness. There is no guarding.  Musculoskeletal:     Cervical back: Normal range of motion and neck supple.     Right lower leg: No edema.     Left lower leg: No edema.  Skin:    General: Skin is warm and dry.     Capillary Refill: Capillary refill takes less than 2 seconds.     Coloration: Skin is not pale.  Neurological:     General: No focal deficit present.     Mental Status: She is alert and oriented to person, place, and time.     Motor: No weakness.  Psychiatric:        Mood and Affect: Mood normal.        Behavior: Behavior normal.      CXR Results 08/10/2020 FINDINGS: The heart size and mediastinal contours are within normal limits. No pneumothorax or pleural effusion is noted. Multiple ill-defined opacities are noted throughout both lungs which are significantly decreased compared to prior exam, most consistent with residual multifocal pneumonia or postinfectious scarring. The visualized skeletal structures are unremarkable.   BP 126/73   Pulse 72   Temp 98.1 F (36.7 C)   Ht 5\' 4"  (1.626 m)   Wt 187 lb 11.2 oz (85.1 kg)   SpO2 99%   BMI 32.22 kg/m  Wt Readings from Last 3 Encounters:  09/21/20 187 lb 11.2 oz (85.1 kg)  09/14/20 187 lb (84.8 kg)  08/10/20 183 lb (83 kg)    Health Maintenance Due  Topic Date Due  . COVID-19 Vaccine (1) Never done    There are no preventive care reminders to display for this patient.   Lab Results  Component Value Date   TSH 4.50 11/25/2019   Lab Results  Component Value Date   WBC 7.0 11/25/2019   HGB 14.3 11/25/2019   HCT 42.4 11/25/2019   MCV 89.8 11/25/2019   PLT 252 11/25/2019   Lab Results  Component Value Date   NA 139 11/25/2019   K 4.4 11/25/2019   CO2  26 11/25/2019   GLUCOSE 79 11/25/2019   BUN 16 11/25/2019   CREATININE 0.89 11/25/2019   BILITOT 0.3 11/25/2019   ALKPHOS 73 02/18/2010   AST 22 11/25/2019   ALT 20 11/25/2019   PROT 6.9 11/25/2019   ALBUMIN 3.7 02/18/2010   CALCIUM 9.8 11/25/2019   Lab Results  Component Value Date   CHOL 196 11/25/2019   Lab Results  Component Value Date   HDL 49 (L) 11/25/2019   Lab Results  Component Value Date   LDLCALC 111 (H) 11/25/2019   Lab Results  Component Value Date   TRIG 250 (H) 11/25/2019   Lab Results  Component Value Date   CHOLHDL 4.0 11/25/2019   No results found for: HGBA1C     Assessment & Plan:   Problem List Items Addressed This Visit      Other   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  No cardiac abnormalities encountered today on evaluation. Did discuss the option of EKG, however, patient declined at this time. HR and rhythm regular with no murmurs or rubs, PMI not displaced. Recent CXR does not show enlargement of heart and no signs of CHF present.  We do not have access to records from hospitalization, so I am unable to personally review the  EKG records, however, recent chest x-ray results and images reviewed.  The area of described pain does show increased scarring and infiltrates.  There is decreased movement in the right mid and lower lobes consistent with the areas of pain noted.  No symptoms consistent with GERD present.  Strongly suspect bronchospasm may exacerbate some of her symptoms.  Recommend trying the albuterol inhaler for at least the next two days to see if this helps with her symptoms at all and monitor for environmental factors that seem to exacerbate symptoms.  Wood burning stove within the home may be contributing to bronchospasms, but this is not an option for change at this time.  Recommend follow-up if symptoms  persist.            Orma Render, NP

## 2020-09-21 NOTE — Assessment & Plan Note (Addendum)
Symptoms and presentation consistent with pleural pain related to post COVID infiltrates, scarring, and suspected bronchospasm.  No cardiac abnormalities encountered today on evaluation. Did discuss the option of EKG, however, patient declined at this time. HR and rhythm regular with no murmurs or rubs, PMI not displaced. Recent CXR does not show enlargement of heart and no signs of CHF present.  We do not have access to records from hospitalization, so I am unable to personally review the EKG records, however, recent chest x-ray results and images reviewed.  The area of described pain does show increased scarring and infiltrates.  There is decreased movement in the right mid and lower lobes consistent with the areas of pain noted.  No symptoms consistent with GERD present.  Strongly suspect bronchospasm may exacerbate some of her symptoms.  Recommend trying the albuterol inhaler for at least the next two days to see if this helps with her symptoms at all and monitor for environmental factors that seem to exacerbate symptoms.  Wood burning stove within the home may be contributing to bronchospasms, but this is not an option for change at this time.  Recommend follow-up if symptoms persist.

## 2020-09-21 NOTE — Patient Instructions (Signed)
I believe the pain is related to the scarring and bronchospasms in the lungs. Keep doing your deep breathing exercises and try the inhaler to see if this helps with some of the pain.    Bronchospasm, Adult  Bronchospasm is when airways in the lungs get smaller. When this happens, it can be hard to breathe. You may cough. You may also make a whistling sound when you breathe (wheeze). Follow these instructions at home: Medicines  Take over-the-counter and prescription medicines only as told by your doctor.  If you need to use an inhaler or nebulizer to take your medicine, ask your doctor how to use it.  If you were given a spacer, always use it with your inhaler. Lifestyle  Change your heating and air conditioning filter. Do this at least once a month.  Try not to use fireplaces and wood stoves.  Do not  smoke. Do not  allow smoking in your home.  Try not to use things that have a strong smell, like perfume.  Get rid of pests (such as roaches and mice) and their poop.  Remove any mold from your home.  Keep your house clean. Get rid of dust.  Use cleaning products that have no smell.  Replace carpet with wood, tile, or vinyl flooring.  Use allergy-proof pillows, mattress covers, and box spring covers.  Wash bed sheets and blankets every week. Use hot water. Dry them in a dryer.  Use blankets that are made of polyester or cotton.  Wash your hands often.  Keep pets out of your bedroom.  When you exercise, try not to breathe in cold air. General instructions  Have a plan for getting medical care. Know these things: ? When to call your doctor. ? When to call local emergency services (911 in the [place] ? Where to go in an emergency.  Stay up to date on your shots (immunizations).  When you have an episode: ? Stay calm. ? Relax. ? Breathe slowly. Contact a doctor if:  Your muscles ache.  Your chest hurts.  The color of the mucus you cough up (sputum) changes  from clear or white to yellow, green, gray, or bloody.  The mucus you cough up gets thicker.  You have a fever. Get help right away if:  The whistling sound gets worse, even after you take your medicines.  Your coughing gets worse.  You find it even harder to breathe.  Your chest hurts very much. Summary  Bronchospasm is when airways in the lungs get smaller.  When this happens, it can be hard to breathe. You may cough. You may also make a whistling sound when you breathe.  Stay away from things that cause you to have episodes. These include smoke or dust. This information is not intended to replace advice given to you by your health care provider. Make sure you discuss any questions you have with your health care provider. Document Revised: 09/08/2017 Document Reviewed: 09/29/2016 Elsevier Patient Education  2020 [employer].

## 2020-09-23 NOTE — Telephone Encounter (Signed)
Noted  

## 2020-09-24 ENCOUNTER — Telehealth: Payer: Self-pay

## 2020-09-24 NOTE — Telephone Encounter (Signed)
Amanda Walters called and left a message stating since she has stopped using her wood stove she is feeling much better.

## 2020-10-05 DIAGNOSIS — M1712 Unilateral primary osteoarthritis, left knee: Secondary | ICD-10-CM | POA: Diagnosis not present

## 2020-10-06 DIAGNOSIS — M65331 Trigger finger, right middle finger: Secondary | ICD-10-CM | POA: Diagnosis not present

## 2020-10-07 ENCOUNTER — Other Ambulatory Visit: Payer: Self-pay

## 2020-10-07 ENCOUNTER — Encounter: Payer: Self-pay | Admitting: Osteopathic Medicine

## 2020-10-07 ENCOUNTER — Ambulatory Visit (INDEPENDENT_AMBULATORY_CARE_PROVIDER_SITE_OTHER): Payer: PPO | Admitting: Osteopathic Medicine

## 2020-10-07 VITALS — BP 136/78 | HR 78 | Temp 98.1°F | Wt 189.5 lb

## 2020-10-07 DIAGNOSIS — Z01818 Encounter for other preprocedural examination: Secondary | ICD-10-CM | POA: Diagnosis not present

## 2020-10-07 DIAGNOSIS — Z7189 Other specified counseling: Secondary | ICD-10-CM | POA: Diagnosis not present

## 2020-10-07 NOTE — Progress Notes (Signed)
Amanda Walters is a 64 y.o. female who presents to  [facility] at [facility]  today, 10/07/20, seeking care for the following:  Surgical clearance   [name] denies chest pain at rest or with exertion, denies dyspnea at rest or with exertion,denies lower extremity edema, denies claudication or palpitations.  Patient has negative history of ischemic heart disease, CHF, CVD, diabetes, alcohol or drug abuse, recent anticoagulation or antithrombotic use, personal or family history of quite neuropathy, or chronic kidney disease.   Patient hasnegative history of undergoing cardiac stress test or catheterization, coronary revascularization.   Patient reports active and no chest pain       ASSESSMENT & PLAN with other pertinent findings:  The primary encounter diagnosis was Cardiac risk counseling. A diagnosis of Pre-operative clearance was also pertinent to this visit.  Patient's cardiac risk factors include none.   According to the RCR I = 0, this number of risk factors stratify as the patient to class 1 which carries with it a approx 3% percent risk of major cardiovascular complications such as MI, CHF, malignant arrhythmia.   In this case however the RCR I likely accurately -estimates the patient's true cardiac risk given her lack of serious comorbids     No results found for this or any previous visit (from the past 24 hour(s)).     There are no Patient Instructions on file for this visit.  Orders Placed This Encounter  Procedures  . EKG 12-Lead   EKG interpretation: Rate: 64 Rhythm: sinus No ST/T changes concerning for acute ischemia/infarct  Previous EKG no tracings available to review   No orders of the defined types were placed in this encounter.      Follow-up instructions: No follow-ups on file.                                         BP 136/78   Pulse 78   Temp 98.1  F (36.7 C)   Wt 189 lb 8 oz (86 kg)   SpO2 98%   BMI 32.53 kg/m   No outpatient medications have been marked as taking for the 10/07/20 encounter (Office Visit) with Amanda Nielsen, DO.    No results found for this or any previous visit (from the past 72 hour(s)).  No results found.     All questions at time of visit were answered - patient instructed to contact office with any additional concerns or updates.  ER/RTC precautions were reviewed with the patient as applicable.   Please note: voice recognition software was used to produce this document, and typos may escape review. Please contact Dr. Lyn Hollingshead for any needed clarifications.   Total encounter time: 30 minutes counseling and coordinating care, reviewing records

## 2020-10-08 ENCOUNTER — Telehealth: Payer: Self-pay | Admitting: Osteopathic Medicine

## 2020-10-08 ENCOUNTER — Telehealth: Payer: Self-pay

## 2020-10-08 NOTE — Telephone Encounter (Signed)
Pt called regarding p/w for surgical clearance. Per pt, [facility] did not received the form. Form has been re-faxed to [phone] on 10/08/20 at 447 pm. Confirmation fax received.   

## 2020-10-08 NOTE — Telephone Encounter (Signed)
PT left a phone message stating she is upset that her surgical clearance she seen Dr.Alexander for yesterday has not been faxed to her surgical scheduler. She would like a call back Today

## 2020-10-08 NOTE — Telephone Encounter (Signed)
Pt called regarding p/w for surgical clearance. Per pt, [facility] did not received the form. Form has been re-faxed to [phone] on 10/08/20 at 447 pm. Confirmation fax received.

## 2020-10-08 NOTE — Telephone Encounter (Signed)
See Vanicia's note This is not an emergency and can be handled routinely

## 2020-10-12 ENCOUNTER — Telehealth: Payer: Self-pay

## 2020-10-12 DIAGNOSIS — E039 Hypothyroidism, unspecified: Secondary | ICD-10-CM

## 2020-10-12 DIAGNOSIS — Z7189 Other specified counseling: Secondary | ICD-10-CM

## 2020-10-12 DIAGNOSIS — Z01818 Encounter for other preprocedural examination: Secondary | ICD-10-CM

## 2020-10-12 NOTE — Telephone Encounter (Signed)
Amanda Walters called and states the surgical clearance Dr Lyn Hollingshead had suggested he have labs per protocol. The office will not be doing labs prior to procedure. Please advise what labs patient needs before surgery.

## 2020-10-13 DIAGNOSIS — Z01818 Encounter for other preprocedural examination: Secondary | ICD-10-CM | POA: Diagnosis not present

## 2020-10-13 DIAGNOSIS — E039 Hypothyroidism, unspecified: Secondary | ICD-10-CM | POA: Diagnosis not present

## 2020-10-13 DIAGNOSIS — Z7189 Other specified counseling: Secondary | ICD-10-CM | POA: Diagnosis not present

## 2020-10-13 NOTE — Telephone Encounter (Signed)
Spoke with patient and patient will be here in the next hour to get her labs drawn.

## 2020-10-13 NOTE — Telephone Encounter (Signed)
Labs ordered.

## 2020-10-14 LAB — CBC
HCT: 43 % (ref 35.0–45.0)
Hemoglobin: 14.5 g/dL (ref 11.7–15.5)
MCH: 30.7 pg (ref 27.0–33.0)
MCHC: 33.7 g/dL (ref 32.0–36.0)
MCV: 90.9 fL (ref 80.0–100.0)
MPV: 10 fL (ref 7.5–12.5)
Platelets: 247 10*3/uL (ref 140–400)
RBC: 4.73 10*6/uL (ref 3.80–5.10)
RDW: 12.2 % (ref 11.0–15.0)
WBC: 9.4 10*3/uL (ref 3.8–10.8)

## 2020-10-14 LAB — COMPLETE METABOLIC PANEL WITH GFR
AG Ratio: 1.7 (calc) (ref 1.0–2.5)
ALT: 17 U/L (ref 6–29)
AST: 17 U/L (ref 10–35)
Albumin: 4.2 g/dL (ref 3.6–5.1)
Alkaline phosphatase (APISO): 53 U/L (ref 37–153)
BUN/Creatinine Ratio: 21 (calc) (ref 6–22)
BUN: 22 mg/dL (ref 7–25)
CO2: 26 mmol/L (ref 20–32)
Calcium: 9.6 mg/dL (ref 8.6–10.4)
Chloride: 103 mmol/L (ref 98–110)
Creat: 1.06 mg/dL — ABNORMAL HIGH (ref 0.50–0.99)
GFR, Est African American: 64 mL/min/{1.73_m2} (ref >=60)
GFR, Est Non African American: 55 mL/min/{1.73_m2} — ABNORMAL LOW (ref >=60)
Globulin: 2.5 g/dL (calc) (ref 1.9–3.7)
Glucose, Bld: 131 mg/dL (ref 65–139)
Potassium: 3.9 mmol/L (ref 3.5–5.3)
Sodium: 138 mmol/L (ref 135–146)
Total Bilirubin: 0.2 mg/dL (ref 0.2–1.2)
Total Protein: 6.7 g/dL (ref 6.1–8.1)

## 2020-10-14 LAB — TSH: TSH: 4.82 mIU/L — ABNORMAL HIGH (ref 0.40–4.50)

## 2020-10-30 DIAGNOSIS — Z01818 Encounter for other preprocedural examination: Secondary | ICD-10-CM | POA: Diagnosis not present

## 2020-11-12 DIAGNOSIS — G8918 Other acute postprocedural pain: Secondary | ICD-10-CM | POA: Diagnosis not present

## 2020-11-12 DIAGNOSIS — M1712 Unilateral primary osteoarthritis, left knee: Secondary | ICD-10-CM | POA: Diagnosis not present

## 2020-11-12 HISTORY — PX: REPLACEMENT TOTAL KNEE: SUR1224

## 2020-11-17 DIAGNOSIS — M25562 Pain in left knee: Secondary | ICD-10-CM | POA: Diagnosis not present

## 2020-11-20 DIAGNOSIS — M25562 Pain in left knee: Secondary | ICD-10-CM | POA: Diagnosis not present

## 2020-11-23 ENCOUNTER — Other Ambulatory Visit (HOSPITAL_COMMUNITY): Payer: Self-pay | Admitting: Specialist

## 2020-11-23 ENCOUNTER — Ambulatory Visit (HOSPITAL_COMMUNITY)
Admission: RE | Admit: 2020-11-23 | Discharge: 2020-11-23 | Disposition: A | Discharge status: Home / Self Care | Payer: PPO | Source: Ambulatory Visit | Attending: Specialist | Admitting: Specialist

## 2020-11-23 ENCOUNTER — Other Ambulatory Visit: Payer: Self-pay

## 2020-11-23 DIAGNOSIS — M7989 Other specified soft tissue disorders: Secondary | ICD-10-CM | POA: Diagnosis not present

## 2020-11-23 DIAGNOSIS — M79662 Pain in left lower leg: Secondary | ICD-10-CM

## 2020-11-25 ENCOUNTER — Telehealth: Payer: Self-pay | Admitting: Pulmonary Disease

## 2020-11-25 DIAGNOSIS — Z4789 Encounter for other orthopedic aftercare: Secondary | ICD-10-CM | POA: Diagnosis not present

## 2020-11-25 DIAGNOSIS — Z96652 Presence of left artificial knee joint: Secondary | ICD-10-CM | POA: Diagnosis not present

## 2020-11-25 MED ORDER — IPRATROPIUM BROMIDE 0.03 % NA SOLN: 1.0000 | Freq: Two times a day (BID) | NASAL | 2 refills | Status: DC | PRN

## 2020-11-25 MED ORDER — FLUTICASONE PROPIONATE 50 MCG/ACT NA SUSP: 1.0000 | Freq: Every day | NASAL | 2 refills | Status: DC

## 2020-11-25 NOTE — Telephone Encounter (Signed)
She should start fluticasone nasal spray, 1 spray per nostril daily along with ipratropium nasal spray, 1 spray twice daily as needed for sinus drainage.   Thanks, Wille Glaser

## 2020-11-25 NOTE — Telephone Encounter (Signed)
Call returned to patient, confirmed DOB. Patient reports she saw Dr Erin Fulling back in the December and at that time he told her she has minimal fluid on her lungs. She states she noticed some bronchial tube irritation then and that same feeling has come back. She reports SOB and chest tightness. Denies active chs pain. She reports a cough however she reports she has constant sinus drainage so this is not out of the norm for her. She has attempted using her albuterol inhaler and she does not feel that this makes much of a difference. Denies fevers. She has not been tested for covid. Patient is requesting recommendations from Dr Erin Fulling.   MD please advise.   Thanks :)

## 2020-11-25 NOTE — Telephone Encounter (Signed)
ATC patient to let her know recs of Dr. Erin Fulling. Per DPR left detailed message letting her know about 2 prescriptions that he wants her to start. They have been sent to preferred pharmacy listed in patient's mychart. Advised her to call back with any questions or concerns. Nothing further needed at this time.

## 2020-11-27 DIAGNOSIS — M25562 Pain in left knee: Secondary | ICD-10-CM | POA: Diagnosis not present

## 2020-11-30 ENCOUNTER — Telehealth: Payer: Self-pay | Admitting: Neurology

## 2020-11-30 DIAGNOSIS — M25562 Pain in left knee: Secondary | ICD-10-CM | POA: Diagnosis not present

## 2020-11-30 NOTE — Telephone Encounter (Signed)
Called pt. To schedule an appt. and she stated that she would call us back

## 2020-11-30 NOTE — Telephone Encounter (Signed)
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 They placed her on Xarelto and told her to follow up with her PCP. Patient left vm.   Please call her to schedule follow up with Dr. Sheppard Coil.

## 2020-12-01 ENCOUNTER — Other Ambulatory Visit: Payer: Self-pay

## 2020-12-01 ENCOUNTER — Telehealth: Payer: Self-pay | Admitting: Osteopathic Medicine

## 2020-12-01 ENCOUNTER — Ambulatory Visit (INDEPENDENT_AMBULATORY_CARE_PROVIDER_SITE_OTHER): Payer: PPO | Admitting: Osteopathic Medicine

## 2020-12-01 ENCOUNTER — Encounter: Payer: Self-pay | Admitting: Osteopathic Medicine

## 2020-12-01 VITALS — BP 137/77 | HR 99 | Temp 98.2°F | Wt 189.0 lb

## 2020-12-01 DIAGNOSIS — I82A12 Acute embolism and thrombosis of left axillary vein: Secondary | ICD-10-CM

## 2020-12-01 MED ORDER — RIVAROXABAN 20 MG PO TABS: 20.0000 mg | ORAL_TABLET | Freq: Every day | ORAL | 0 refills | Status: DC

## 2020-12-01 MED ORDER — XARELTO 15 MG PO TABS
15.0000 mg | ORAL_TABLET | Freq: Two times a day (BID) | ORAL | 0 refills | Status: DC
Start: 2020-11-23 — End: 2021-03-16

## 2020-12-01 NOTE — Progress Notes (Signed)
Amanda Walters is a 65 y.o. female who presents to  [place] at [facility]  today, 12/01/20, seeking care for the following:  . Postsurgical DVT per patient . Reviewed records: [place] LLE 11/23/20 (POD #11) positive for acute non-occlusive DVT --> started on Xarelto 11/23/20 at 15 mg bid x21 days  . Pt reports taking the Xarelto without issue, no concerns on recent [place] with other pertinent findings:  The encounter diagnosis was Acute deep vein thrombosis (DVT) of axillary vein of left upper extremity ([place]).    Meds ordered this encounter  Medications  . rivaroxaban (XARELTO) 20 MG TABS tablet    Sig: Take 1 tablet (20 mg total) by mouth daily. START on 12/14/20 and complete after 70 days (02/22/21) for total 90 days therapy for DVT    Dispense:  70 tablet    Refill:  0  . XARELTO 15 MG TABS tablet    Sig: Take 1 tablet (15 mg total) by mouth 2 (two) times daily for 21 days.    Dispense:  42 tablet    Refill:  0     See below for relevant physical exam findings  See below for recent lab and imaging results reviewed  Medications, allergies, PMH, PSH, SocH, FamH reviewed below    Follow-up instructions: Return if symptoms worsen or fail to improve.                                        Exam:  BP 137/77 (BP Location: Left Arm, Patient Position: Sitting, Cuff Size: Normal)   Pulse 99   Temp 98.2 F (36.8 C) (Oral)   Wt 189 lb 0.6 oz (85.7 kg)   BMI 32.45 kg/m   Constitutional: VS see above. General Appearance: alert, well-developed, well-nourished, NAD  Neck: No masses, trachea midline.   Respiratory: Normal respiratory effort. no wheeze, no rhonchi, no rales  Cardiovascular: S1/S2 normal, no murmur, no rub/gallop auscultated. RRR.   Musculoskeletal: bandages on L knee c/w knee replacement, wound appears healing normally but visualization limited d/t steristrips,  compression stocking in place LLE    Abdominal: non-tender, non-distended, no appreciable organomegaly, neg Murphy's, BS [id number]  Neurological: Normal balance/coordination. No tremor.  Psychiatric: Normal judgment/insight. Normal mood and affect. Oriented x3.   Current Meds  Medication Sig  . albuterol (VENTOLIN HFA) 108 (90 Base) MCG/ACT inhaler Inhale 2 puffs into the lungs every 6 (six) hours as needed.  Marland Kitchen ascorbic acid (VITAMIN C) 500 MG tablet Vitamin C 500 mg tablet   2 tablets every day by oral route.  . Calcium Carbonate-Vit D-Min (CALCIUM 1200 PO) Take by mouth.  . Calcium Citrate (CITRACAL PO) Take by mouth.  . estradiol (ESTRACE) 1 MG tablet   . Loratadine 10 MG CAPS Take by mouth.  Marland Kitchen MAGNESIUM PO Take by mouth daily.  . meloxicam (MOBIC) 15 MG tablet Take 15 mg by mouth daily.  . Misc Natural Products (COSAMIN ASU ADVANCED FORMULA PO) Take by mouth.  . Multiple Vitamins-Minerals (MULTIVITAMIN PO) Take by mouth.  . Multiple Vitamins-Minerals (ZINC PO) Take by mouth daily.  Marland Kitchen omeprazole (PRILOSEC) 20 MG capsule Take 20 mg by mouth daily.  . rivaroxaban (XARELTO) 20 MG TABS tablet Take 1 tablet (20 mg total) by mouth daily. START on 12/14/20 and complete after 70 days (02/22/21) for total  90 days therapy for DVT  . vitamin B-12 (CYANOCOBALAMIN) 1000 MCG tablet Take 1,000 mcg by mouth daily.    Allergies  Allergen Reactions  . Morphine And Related Itching  . Sulfa Antibiotics Hives  . Morphine Hives  . Cephalexin Other (See Comments)    "funny feeling"    Patient Active Problem List   Diagnosis Date Noted  . 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 Abnormal x-ray of lung 08/12/2020  . 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 Arthritis of right hand 12/27/2018  . Osteoarthritis of carpometacarpal (CMC) joint of thumb 03/06/2018  . Umbilical hernia 95/63/8756  . Foreign body (FB) in soft tissue 07/04/2014    Family History  Problem Relation Age of Onset  . Heart attack  Other        mother     Social History   Tobacco Use  Smoking Status Never Smoker  Smokeless Tobacco Never Used    Past Surgical History:  Procedure Laterality Date  . ABDOMINAL HYSTERECTOMY  1993  . CHOLECYSTECTOMY    . HERNIA REPAIR     ventral hernia   . KNEE ARTHROSCOPY  2006  . KNEE ARTHROSCOPY  2012  . TOTAL KNEE ARTHROPLASTY  2011    Immunization History  Administered Date(s) Administered  . Fluad Quad(high Dose 65+) 09/14/2020  . Influenza,inj,Quad PF,6+ Mos 07/11/2017, 07/16/2018, 07/24/2019  . Influenza-Unspecified 07/10/2014  . PPD Test 11/25/2019  . Tdap 06/11/2016, 02/16/2019  . Zoster Recombinat (Shingrix) 07/11/2017, 09/14/2017    Recent Results (from the past 2160 hour(s))  CBC     Status: None   Collection Time: 10/13/20  2:02 PM  Result Value Ref Range   WBC 9.4 3.8 - 10.8 Thousand/uL   RBC 4.73 3.80 - 5.10 Million/uL   Hemoglobin 14.5 11.7 - 15.5 g/dL   HCT 43.0 35.0 - 45.0 %   MCV 90.9 80.0 - 100.0 fL   MCH 30.7 27.0 - 33.0 pg   MCHC 33.7 32.0 - 36.0 g/dL   RDW 12.2 11.0 - 15.0 %   Platelets 247 140 - 400 Thousand/uL   MPV 10.0 7.5 - 12.5 fL  COMPLETE METABOLIC PANEL WITH GFR     Status: Abnormal   Collection Time: 10/13/20  2:02 PM  Result Value Ref Range   Glucose, Bld 131 65 - 139 mg/dL    Comment: .        Non-fasting reference interval .    BUN 22 7 - 25 mg/dL   Creat 1.06 (H) 0.50 - 0.99 mg/dL    Comment: For patients >34 years of age, the reference limit for Creatinine is approximately 13% higher for people identified as African-American. .    GFR, Est Non African American 55 (L) > OR = 60 mL/min/1.58m2   GFR, Est African American 64 > OR = 60 mL/min/1.39m2   BUN/Creatinine Ratio 21 6 - 22 (calc)   Sodium 138 135 - 146 mmol/L   Potassium 3.9 3.5 - 5.3 mmol/L   Chloride 103 98 - 110 mmol/L   CO2 26 20 - 32 mmol/L   Calcium 9.6 8.6 - 10.4 mg/dL   Total Protein 6.7 6.1 - 8.1 g/dL   Albumin 4.2 3.6 - 5.1 g/dL   Globulin 2.5  1.9 - 3.7 g/dL (calc)   AG Ratio 1.7 1.0 - 2.5 (calc)   Total Bilirubin 0.2 0.2 - 1.2 mg/dL   Alkaline phosphatase (APISO) 53 37 - 153 U/L   AST 17 10 - 35 U/L   ALT  17 6 - 29 U/L  TSH     Status: Abnormal   Collection Time: 10/13/20  2:02 PM  Result Value Ref Range   TSH 4.82 (H) 0.40 - 4.50 mIU/L    No results found.     All questions at time of visit were answered - patient instructed to contact office with any additional concerns or updates. ER/RTC precautions were reviewed with the patient as applicable.   Please note: manual typing as well as voice recognition software may have been used to produce this document - typos may escape review. Please contact Dr. Sheppard Coil for any needed clarifications.

## 2020-12-01 NOTE — Telephone Encounter (Signed)
error 

## 2020-12-02 DIAGNOSIS — M25562 Pain in left knee: Secondary | ICD-10-CM | POA: Diagnosis not present

## 2020-12-04 DIAGNOSIS — M25562 Pain in left knee: Secondary | ICD-10-CM | POA: Diagnosis not present

## 2020-12-04 DIAGNOSIS — M25642 Stiffness of left hand, not elsewhere classified: Secondary | ICD-10-CM | POA: Diagnosis not present

## 2020-12-08 DIAGNOSIS — M25562 Pain in left knee: Secondary | ICD-10-CM | POA: Diagnosis not present

## 2020-12-11 DIAGNOSIS — M25562 Pain in left knee: Secondary | ICD-10-CM | POA: Diagnosis not present

## 2020-12-15 DIAGNOSIS — M25562 Pain in left knee: Secondary | ICD-10-CM | POA: Diagnosis not present

## 2020-12-18 ENCOUNTER — Telehealth: Payer: Self-pay | Admitting: Pulmonary Disease

## 2020-12-18 DIAGNOSIS — M25562 Pain in left knee: Secondary | ICD-10-CM | POA: Diagnosis not present

## 2020-12-18 NOTE — Telephone Encounter (Signed)
Called and spoke with patient who states that she feels like a lot of what is going on is from a household allergen.  Pt states she was doing a nasal spray because her bronchial tubes, can tell a difference. Pt states it's mostly when pt in living room and not at night in bed or when outside. States when she went to rehab she was fine and then once she got back home is when she started having symptoms. But feels fine when in her room at night. Pt states she does have carpet and dogs. Pt wasn't sure if this was significant. Pt states she is getting small amount of secretions up that are clear, and she is aware the pollen is horrible right now. Patient has never had any allergy testing. Patient has an appointment on 3/23. Routing to Dr. Erin Fulling as Juluis Rainier and if you have any recommendations for patient before her upcoming appointment.

## 2020-12-18 NOTE — Telephone Encounter (Signed)
Pt states she was doing a nasal spray because her bronchial tubes, can tell a difference. Pt states it's mostly when pt in living room and not at night in bed or when outside. Pt states she does have carpet and dogs. Pt wasn't sure if this was significant. Pt states she is getting small amount of secretions up, and she is aware the pollen is horrible right now. Please advise [phone]

## 2020-12-21 NOTE — Telephone Encounter (Signed)
What are her main symptoms? This message is not clear other than it sounds like a cough with clear sputum.

## 2020-12-22 DIAGNOSIS — M25562 Pain in left knee: Secondary | ICD-10-CM | POA: Diagnosis not present

## 2020-12-22 NOTE — Telephone Encounter (Signed)
Attempted to call pt but unable to reach. Left message for her to return call. 

## 2020-12-23 NOTE — Telephone Encounter (Signed)
Called pt back for clarification: she had knee sx on 2/3 and stopped all OTC allergy meds that she usually takes (flonase, atrovent nasal spray, loratadine).  Pt started these back approx 1-2 weeks ago, and allergy symptoms have improved, but does still note R sided chest ache, prod cough with small amount of clear mucus only when she is in her living room.  States that she does not experience this when out of the home, or when she is in her bedroom.  Pt has stopped using the flonase and atrovent, and is now using equate nasal spray (unsure of generic equivalent). Pt wonders if there is something in her living room that could be causing these s/s.  Pt does have carpet in living room, and has dogs, but dogs sleep in bedroom and she has no s/s in bedroom.   Pt is not in distress, wants to let JD know before appt so he can plan appropriately for any testing- pt had suggested allergy testing but does not want skin testing. Will remove urgent mark from this message.   Forwarding to [place] as Juluis Rainier- please advise if anything is needed before 3/23 appt.  Thanks!   Pharmacy: [facility] in [place]

## 2020-12-24 NOTE — Telephone Encounter (Signed)
Nothing needed prior to her next visit.   Thanks, Wille Glaser

## 2020-12-25 DIAGNOSIS — M25562 Pain in left knee: Secondary | ICD-10-CM | POA: Diagnosis not present

## 2020-12-29 DIAGNOSIS — M25562 Pain in left knee: Secondary | ICD-10-CM | POA: Diagnosis not present

## 2020-12-30 ENCOUNTER — Ambulatory Visit (INDEPENDENT_AMBULATORY_CARE_PROVIDER_SITE_OTHER): Payer: PPO | Admitting: Pulmonary Disease

## 2020-12-30 ENCOUNTER — Other Ambulatory Visit: Payer: Self-pay

## 2020-12-30 ENCOUNTER — Ambulatory Visit (INDEPENDENT_AMBULATORY_CARE_PROVIDER_SITE_OTHER): Payer: PPO

## 2020-12-30 ENCOUNTER — Encounter: Payer: Self-pay | Admitting: Pulmonary Disease

## 2020-12-30 VITALS — BP 128/68 | HR 89 | Temp 97.3°F | Ht 65.0 in | Wt 190.0 lb

## 2020-12-30 DIAGNOSIS — R059 Cough, unspecified: Secondary | ICD-10-CM | POA: Diagnosis not present

## 2020-12-30 DIAGNOSIS — U071 COVID-19: Secondary | ICD-10-CM

## 2020-12-30 DIAGNOSIS — J1282 Pneumonia due to coronavirus disease 2019: Secondary | ICD-10-CM | POA: Diagnosis not present

## 2020-12-30 DIAGNOSIS — J189 Pneumonia, unspecified organism: Secondary | ICD-10-CM | POA: Diagnosis not present

## 2020-12-30 MED ORDER — FLOVENT HFA 110 MCG/ACT IN AERO: 2.0000 | INHALATION_SPRAY | Freq: Two times a day (BID) | RESPIRATORY_TRACT | 12 refills | Status: DC

## 2020-12-30 NOTE — Patient Instructions (Signed)
We will check a chest x-ray today  Start flovent inhaler 2 puffs twice daily - rinse mouth out after each use  Continue flonase nasal spray

## 2020-12-30 NOTE — Progress Notes (Signed)
Synopsis: Referred by Jacolyn Reedy, NP for cough, post-covid 19 pneumonia  Subjective:   PATIENT ID: Amanda Walters GENDER: female DOB: 13-Jul-1956, MRN: [id number]   HPI  Chief Complaint  Patient presents with  . Follow-up    Pt states she's been using here rescue inhaler,feels twinge between shoulder blade.   Amanda Walters is a 65 year old woman, never smoker who returns to pulmonary clinic for post-covid 19 pneumonia symptoms.   Since last visit her shortness of breath has resolved. She has occasional cough with mucous production. The steroid nasal spray has helped with her sinus congestion and drainage along with reducing her cough frequency.   She does notice twinges of discomfort in her chest sub-sternally and along her right shoulder blade. They are fleeting a last only for a brief moment. She recently had left knee surgery and has been on crutches. She is unable to tell if the pains started since she was using crutches.  OV 09/14/20 She was hospitalized with covid in early 06/2020. She continues to experience fatigue/tiredness with exertional activity and occasional cough. She denies feeling short of breath with activity, mainly the fatigue. The cough is intermittent and rarely productive with clear sputum. Overall, she reports she is slowly improving since her hospitalization. She has been prescribed an albuterol inhaler which she has not tried yet.   She denies sinus congestion or drainage and denies heartburn/reflux symptoms as she takes omeprazole daily. She denies wheezing. She denies leg swelling. She has no trouble sleeping at night. She denies PND, night time awakenings due to cough or shortness of breath. She feels rested in the mornings.    Chest radiograph from 06/10/20 when compared to 08/10/20, shows improvement in bilateral patchy opacities.   Past Medical History:  Diagnosis Date  . COVID-19 06/05/2020  . Fever due to COVID-19 06/05/2020     Family History  Problem  Relation Age of Onset  . Heart attack Other        mother      Social History   Socioeconomic History  . Marital status: Single    Spouse name: Not on file  . Number of children: Not on file  . Years of education: Not on file  . Highest education level: Not on file  Occupational History  . Occupation: [employer] Teacher  Tobacco Use  . Smoking status: Never Smoker  . Smokeless tobacco: Never Used  Vaping Use  . Vaping Use: Never used  Substance and Sexual Activity  . Alcohol use: Not Currently  . Drug use: Never  . Sexual activity: Not Currently  Other Topics Concern  . Not on file  Social History Narrative  . Not on file   Social Determinants of Health   Financial Resource Strain: Not on file  Food Insecurity: Not on file  Transportation Needs: Not on file  Physical Activity: Not on file  Stress: Not on file  Social Connections: Not on file  Intimate Partner Violence: Not on file     Allergies  Allergen Reactions  . Morphine And Related Itching  . Sulfa Antibiotics Hives  . Morphine Hives  . Cephalexin Other (See Comments)    "funny feeling"     Outpatient Medications Prior to Visit  Medication Sig Dispense Refill  . albuterol (VENTOLIN HFA) 108 (90 Base) MCG/ACT inhaler Inhale 2 puffs into the lungs every 6 (six) hours as needed.    Marland Kitchen ascorbic acid (VITAMIN C) 500 MG tablet Vitamin C 500 mg tablet  2 tablets every day by oral route.    . Calcium Citrate (CITRACAL PO) Take by mouth.    . estradiol (ESTRACE) 1 MG tablet     . Loratadine 10 MG CAPS Take by mouth.    Marland Kitchen MAGNESIUM PO Take by mouth daily.    . Multiple Vitamins-Minerals (MULTIVITAMIN PO) Take by mouth.    . Multiple Vitamins-Minerals (ZINC PO) Take by mouth daily.    Marland Kitchen omeprazole (PRILOSEC) 20 MG capsule Take 20 mg by mouth daily.    . rivaroxaban (XARELTO) 20 MG TABS tablet Take 1 tablet (20 mg total) by mouth daily. START on 12/14/20 and complete after 70 days (02/22/21) for total 90 days therapy  for DVT 70 tablet 0  . vitamin B-12 (CYANOCOBALAMIN) 1000 MCG tablet Take 1,000 mcg by mouth daily.    . Calcium Carbonate-Vit D-Min (CALCIUM 1200 PO) Take by mouth.    . fluticasone (FLONASE) 50 MCG/ACT nasal spray Place 1 spray into both nostrils daily. (Patient not taking: No sig reported) 16 g 2  . ipratropium (ATROVENT) 0.03 % nasal spray Place 1 spray into both nostrils 2 (two) times daily as needed for rhinitis. (Patient not taking: No sig reported) 30 mL 2  . meloxicam (MOBIC) 15 MG tablet Take 15 mg by mouth daily. (Patient not taking: Reported on 12/30/2020)    . Misc Natural Products (COSAMIN ASU ADVANCED FORMULA PO) Take by mouth. (Patient not taking: Reported on 12/30/2020)    . XARELTO 15 MG TABS tablet Take 1 tablet (15 mg total) by mouth 2 (two) times daily for 21 days. 42 tablet 0   No facility-administered medications prior to visit.    Review of Systems  Constitutional: Negative for chills, fever, malaise/fatigue and weight loss.  HENT: Negative for congestion, sinus pain and sore throat.   Eyes: Negative.   Respiratory: Positive for cough and sputum production. Negative for hemoptysis, shortness of breath and wheezing.   Cardiovascular: Negative for chest pain, palpitations, leg swelling and PND.  Gastrointestinal: Negative for abdominal pain, heartburn and nausea.  Genitourinary: Negative.   Skin: Negative.   Neurological: Negative for dizziness, weakness and headaches.  Endo/Heme/Allergies: Negative.   Psychiatric/Behavioral: Negative.    Objective:   Vitals:   12/30/20 1214  BP: 128/68  Pulse: 89  Temp: (!) 97.3 F (36.3 C)  TempSrc: Temporal  SpO2: 99%  Weight: 190 lb (86.2 kg)  Height: 5\' 5"  (1.651 m)    Physical Exam Constitutional:      General: She is not in acute distress.    Appearance: Normal appearance. She is not ill-appearing.  HENT:     Head: Normocephalic and atraumatic.     Nose: Nose normal.     Mouth/Throat:     Mouth: Mucous  membranes are moist.     Pharynx: Oropharynx is clear.  Eyes:     General: No scleral icterus.    Conjunctiva/sclera: Conjunctivae normal.     Pupils: Pupils are equal, round, and reactive to light.  Cardiovascular:     Rate and Rhythm: Normal rate and regular rhythm.     Pulses: Normal pulses.     Heart sounds: Normal heart sounds.  Pulmonary:     Effort: Pulmonary effort is normal.     Breath sounds: No wheezing, rhonchi or rales.  Abdominal:     General: Bowel sounds are normal.     Palpations: Abdomen is soft.  Musculoskeletal:     Cervical back: Neck supple.  Right lower leg: No edema.     Left lower leg: No edema.  Skin:    General: Skin is warm and dry.     Capillary Refill: Capillary refill takes less than 2 seconds.  Neurological:     General: No focal deficit present.     Mental Status: She is alert.  Psychiatric:        Mood and Affect: Mood normal.        Behavior: Behavior normal.        Thought Content: Thought content normal.        Judgment: Judgment normal.    CBC    Component Value Date/Time   WBC 9.4 10/13/2020 1402   RBC 4.73 10/13/2020 1402   HGB 14.5 10/13/2020 1402   HCT 43.0 10/13/2020 1402   PLT 247 10/13/2020 1402   MCV 90.9 10/13/2020 1402   MCH 30.7 10/13/2020 1402   MCHC 33.7 10/13/2020 1402   RDW 12.2 10/13/2020 1402   LYMPHSABS 2,030 11/25/2019 1033   MONOABS 0.6 02/18/2010 1000   EOSABS 490 11/25/2019 1033   BASOSABS 70 11/25/2019 1033   BMP Latest Ref Rng & Units 10/13/2020 11/25/2019 11/06/2017  Glucose 65 - 139 mg/dL 131 79 131(H)  BUN 7 - 25 mg/dL 22 16 14   Creatinine 0.50 - 0.99 mg/dL 1.06(H) 0.89 1.12(H)  BUN/Creat Ratio 6 - 22 (calc) 21 NOT APPLICABLE 13  Sodium 366 - 146 mmol/L 138 139 138  Potassium 3.5 - 5.3 mmol/L 3.9 4.4 4.1  Chloride 98 - 110 mmol/L 103 104 104  CO2 20 - 32 mmol/L 26 26 25   Calcium 8.6 - 10.4 mg/dL 9.6 9.8 9.5   Chest imaging: CXR 08/10/20 The heart size and mediastinal contours are within  normal limits. No pneumothorax or pleural effusion is noted. Multiple ill-defined opacities are noted throughout both lungs which are significantly decreased compared to prior exam, most consistent with residual multifocal pneumonia or postinfectious scarring. The visualized skeletal structures are unremarkable.  PFT: No flowsheet data found.  Labs: Reviewed as above  Echo: No echo on file  Heart Catheterization: No heart cath on file  Assessment & Plan:   Pneumonia due to COVID-19 virus - Plan: DG Chest 2 View, fluticasone (FLOVENT HFA) 110 MCG/ACT inhaler  Discussion: Amanda Walters is a 65 year old woman, never smoker who has been referred to pulmonary clinic for post-covid 19 pneumonia symptoms.   Her symptoms have overall improved significantly. She continues to experience sinus congestion and post-nasal drainage which appears to be aggravated by spring time allergies. She does notice benefit from the steroid nasal spray.   We will start her on flovent 119mcg 2 puffs twice daily and monitor for benefit in the cough and chest discomfort.   We will repeat chest radiograph today for evaluation of resolution of her covid pneumonia.   Follow up in 3 months.   Freda Jackson, MD [facility] Office: [phone]   See Amion for Pager Details    Current Outpatient Medications:  .  albuterol (VENTOLIN HFA) 108 (90 Base) MCG/ACT inhaler, Inhale 2 puffs into the lungs every 6 (six) hours as needed., Disp: , Rfl:  .  ascorbic acid (VITAMIN C) 500 MG tablet, Vitamin C 500 mg tablet   2 tablets every day by oral route., Disp: , Rfl:  .  Calcium Citrate (CITRACAL PO), Take by mouth., Disp: , Rfl:  .  estradiol (ESTRACE) 1 MG tablet, , Disp: , Rfl:  .  fluticasone (FLOVENT HFA) 110 MCG/ACT inhaler, Inhale 2 puffs into the lungs 2 (two) times daily., Disp: 1 each, Rfl: 12 .  Loratadine 10 MG CAPS, Take by mouth., Disp: , Rfl:  .  MAGNESIUM PO, Take by mouth  daily., Disp: , Rfl:  .  Multiple Vitamins-Minerals (MULTIVITAMIN PO), Take by mouth., Disp: , Rfl:  .  Multiple Vitamins-Minerals (ZINC PO), Take by mouth daily., Disp: , Rfl:  .  omeprazole (PRILOSEC) 20 MG capsule, Take 20 mg by mouth daily., Disp: , Rfl:  .  rivaroxaban (XARELTO) 20 MG TABS tablet, Take 1 tablet (20 mg total) by mouth daily. START on 12/14/20 and complete after 70 days (02/22/21) for total 90 days therapy for DVT, Disp: 70 tablet, Rfl: 0 .  vitamin B-12 (CYANOCOBALAMIN) 1000 MCG tablet, Take 1,000 mcg by mouth daily., Disp: , Rfl:  .  fluticasone (FLONASE) 50 MCG/ACT nasal spray, Place 1 spray into both nostrils daily. (Patient not taking: No sig reported), Disp: 16 g, Rfl: 2 .  ipratropium (ATROVENT) 0.03 % nasal spray, Place 1 spray into both nostrils 2 (two) times daily as needed for rhinitis. (Patient not taking: No sig reported), Disp: 30 mL, Rfl: 2 .  meloxicam (MOBIC) 15 MG tablet, Take 15 mg by mouth daily. (Patient not taking: Reported on 12/30/2020), Disp: , Rfl:  .  Misc Natural Products (COSAMIN ASU ADVANCED FORMULA PO), Take by mouth. (Patient not taking: Reported on 12/30/2020), Disp: , Rfl:  .  XARELTO 15 MG TABS tablet, Take 1 tablet (15 mg total) by mouth 2 (two) times daily for 21 days., Disp: 42 tablet, Rfl: 0

## 2020-12-31 DIAGNOSIS — M25562 Pain in left knee: Secondary | ICD-10-CM | POA: Diagnosis not present

## 2021-01-05 DIAGNOSIS — M25562 Pain in left knee: Secondary | ICD-10-CM | POA: Diagnosis not present

## 2021-01-08 DIAGNOSIS — M25562 Pain in left knee: Secondary | ICD-10-CM | POA: Diagnosis not present

## 2021-01-13 DIAGNOSIS — M25562 Pain in left knee: Secondary | ICD-10-CM | POA: Diagnosis not present

## 2021-01-15 DIAGNOSIS — M25562 Pain in left knee: Secondary | ICD-10-CM | POA: Diagnosis not present

## 2021-01-22 DIAGNOSIS — M25562 Pain in left knee: Secondary | ICD-10-CM | POA: Diagnosis not present

## 2021-01-25 ENCOUNTER — Encounter: Payer: Self-pay | Admitting: Osteopathic Medicine

## 2021-01-25 ENCOUNTER — Telehealth: Payer: Self-pay | Admitting: Osteopathic Medicine

## 2021-01-25 ENCOUNTER — Ambulatory Visit (INDEPENDENT_AMBULATORY_CARE_PROVIDER_SITE_OTHER): Payer: PPO | Admitting: Osteopathic Medicine

## 2021-01-25 ENCOUNTER — Other Ambulatory Visit: Payer: Self-pay

## 2021-01-25 VITALS — BP 130/78 | HR 89 | Temp 98.4°F | Wt 192.1 lb

## 2021-01-25 DIAGNOSIS — I872 Venous insufficiency (chronic) (peripheral): Secondary | ICD-10-CM

## 2021-01-25 DIAGNOSIS — Z86718 Personal history of other venous thrombosis and embolism: Secondary | ICD-10-CM | POA: Diagnosis not present

## 2021-01-25 DIAGNOSIS — Z96652 Presence of left artificial knee joint: Secondary | ICD-10-CM

## 2021-01-25 MED ORDER — AMBULATORY NON FORMULARY MEDICATION: 99 refills | Status: DC

## 2021-01-25 NOTE — Telephone Encounter (Signed)
Pneumo 23?

## 2021-01-25 NOTE — Telephone Encounter (Signed)
Patient was questioning about the Pneumonia Vaccine on her way of checking out, wondering if she could have it done? AM

## 2021-01-25 NOTE — Telephone Encounter (Signed)
Immunization History  Administered Date(s) Administered   Fluad Quad(high Dose 65+) 09/14/2020   Influenza,inj,Quad PF,6+ Mos 07/11/2017, 07/16/2018, 07/24/2019   Influenza-Unspecified 07/10/2014   PPD Test 11/25/2019   Tdap 06/11/2016, 02/16/2019   Zoster Recombinat (Shingrix) 07/11/2017, 09/14/2017   Can do Prevnar 20 and then won't need other PNA shots We just got this in I think

## 2021-01-25 NOTE — Patient Instructions (Signed)

## 2021-01-25 NOTE — Telephone Encounter (Signed)
Pt is no longer in the office. Left a detailed vm msg to schedule a NV or wait until her next appt. Direct call back info provided.

## 2021-01-26 DIAGNOSIS — M25562 Pain in left knee: Secondary | ICD-10-CM | POA: Diagnosis not present

## 2021-01-26 NOTE — Progress Notes (Signed)
Amanda Walters is a 65 y.o. female who presents to  [place] at [facility]  today, 01/25/21, seeking care for the following:  . Concern for reddening and slight swelling in the lower extremity on the left.  Recent history of DVT.  She is compliant with anticoagulation as below.  She notes that when she is standing for any length of time, the skin particularly on the anterior shin will become a bit pink.  No significant pain, no edema.  Resolves fairly quickly when she elevates the leg.  Has been ongoing for the past couple of weeks     [place] with other pertinent findings:  The primary encounter diagnosis was Venous insufficiency. Diagnoses of History of DVT (deep vein thrombosis) and History of total left knee replacement were also pertinent to this visit.   Exam more consistent with mild venous insufficiency, possibly some inflammatory changes on a microscopic level status post surgery and a sequela of DVT are also somewhat compromising venous return.  Does not seem to be consistent with new or worsening clot burden, offered ultrasound for reassurance but patient would like to defer at this time unless things get worse which I think is reasonable.  See below for patient instructions.  Recommended compression socks 10 to 20 mmHg compression, elevate leg is much as possible.  Let me know if anything gets worse or changes otherwise continue with current plan for DVT treatment.  Patient Instructions   Chronic Venous Insufficiency Chronic venous insufficiency is a condition where the leg veins cannot effectively pump blood from the legs to the heart. This happens when the vein walls are either stretched, weakened, or damaged, or when the valves inside the vein are damaged. With the right treatment, you should be able to continue with an active life. This condition is also called venous stasis. What are the causes? Common causes of this  condition include:  High blood pressure inside the veins (venous hypertension).  Sitting or standing too long, causing increased blood pressure in the leg veins.  A blood clot that blocks blood flow in a vein (deep vein thrombosis, DVT).  Inflammation of a vein (phlebitis) that causes a blood clot to form.  Tumors in the pelvis that cause blood to back up. What increases the risk? The following factors may make you more likely to develop this condition:  Having a family history of this condition.  Obesity.  Pregnancy.  Living without enough regular physical activity or exercise (sedentary lifestyle).  Smoking.  Having a job that requires long periods of standing or sitting in one place.  Being a certain age. Women in their 9s and 36s and men in their 45s are more likely to develop this condition. What are the signs or symptoms? Symptoms of this condition include:  Veins that are enlarged, bulging, or twisted (varicose veins).  Skin breakdown or ulcers.  Reddened skin or dark discoloration of skin on the leg between the knee and ankle.  Brown, smooth, tight, and painful skin just above the ankle, usually on the inside of the leg (lipodermatosclerosis).  Swelling of the legs. How is this diagnosed? This condition may be diagnosed based on:  Your medical history.  A physical exam.  Tests, such as: ? A procedure that creates an image of a blood vessel and nearby organs and provides information about blood flow through the blood vessel (duplex ultrasound). ? A procedure that tests blood flow (plethysmography). ? A  procedure that looks at the veins using X-ray and dye (venogram). How is this treated? The goals of treatment are to help you return to an active life and to minimize pain or disability. Treatment depends on the severity of your condition, and it may include:  Wearing compression stockings. These can help relieve symptoms and help prevent your condition from  getting worse. However, they do not cure the condition.  Sclerotherapy. This procedure involves an injection of a solution that shrinks damaged veins.  Surgery. This may involve: ? Removing a diseased vein (vein stripping). ? Cutting off blood flow through the vein (laser ablation surgery). ? Repairing or reconstructing a valve within the affected vein.   Follow these instructions at home:  Wear compression stockings as told by your health care provider. These stockings help to prevent blood clots and reduce swelling in your legs.  Take over-the-counter and prescription medicines only as told by your health care provider.  Stay active by exercising, walking, or doing different activities. Ask your health care provider what activities are safe for you and how much exercise you need.  Drink enough fluid to keep your urine pale yellow.  Do not use any products that contain nicotine or tobacco, such as cigarettes, e-cigarettes, and chewing tobacco. If you need help quitting, ask your health care provider.  Keep all follow-up visits as told by your health care provider. This is important.      Contact a health care provider if you:  Have redness, swelling, or more pain in the affected area.  See a red streak or line that goes up or down from the affected area.  Have skin breakdown or skin loss in the affected area, even if the breakdown is small.  Get an injury in the affected area. Get help right away if:  You get an injury and an open wound in the affected area.  You have: ? Severe pain that does not get better with medicine. ? Sudden numbness or weakness in the foot or ankle below the affected area. ? Trouble moving your foot or ankle. ? A fever. ? Worse or persistent symptoms. ? Chest pain. ? Shortness of breath. Summary  Chronic venous insufficiency is a condition where the leg veins cannot effectively pump blood from the legs to the heart.  Chronic venous  insufficiency occurs when the vein walls become stretched, weakened, or damaged, or when valves within the vein are damaged.  Treatment depends on how severe your condition is. It often involves wearing compression stockings and may involve having a procedure.  Make sure you stay active by exercising, walking, or doing different activities. Ask your health care provider what activities are safe for you and how much exercise you need. This information is not intended to replace advice given to you by your health care provider. Make sure you discuss any questions you have with your health care provider. Document Revised: 06/19/2018 Document Reviewed: 06/19/2018 Elsevier Patient Education  2021 [place].    No orders of the defined types were placed in this encounter.   Meds ordered this encounter  Medications  . AMBULATORY NON FORMULARY MEDICATION    Sig: Medication Name: compression stockings, below the knee, 10-20 mmHg. Measure for size. Disp: 5 pairs. Refill x99 prn. Dx: venous stasis, L TKA, L DVT, peripheral vascular disease    Dispense:  1 Units    Refill:  99     See below for relevant physical exam findings  See below for  recent lab and imaging results reviewed  Medications, allergies, PMH, PSH, SocH, FamH reviewed below    Follow-up instructions: Return if symptoms worsen or fail to improve.                                        Exam:  BP 130/78 (BP Location: Left Arm, Patient Position: Sitting, Cuff Size: Normal)   Pulse 89   Temp 98.4 F (36.9 C) (Oral)   Wt 192 lb 1.9 oz (87.1 kg)   BMI 31.97 kg/m   Constitutional: VS see above. General Appearance: alert, well-developed, well-nourished, NAD  Neck: No masses, trachea midline.   Respiratory: Normal respiratory effort. no wheeze, no rhonchi, no rales  Cardiovascular: S1/S2 normal, no murmur, no rub/gallop auscultated. RRR.  Trace lower extremity edema bilaterally just  proximal to ankles.  Musculoskeletal: Gait normal. Symmetric and independent movement of all extremities  Neurological: Normal balance/coordination. No tremor.  Skin: warm, dry, intact.  Very slight red/pink coloration in anterior shin when patient is standing up but this visibly resolves when she sits down with leg elevated after couple of minutes.  Well-healing surgical scar on left anterior knee.  Psychiatric: Normal judgment/insight. Normal mood and affect. Oriented x3.   Current Meds  Medication Sig  . albuterol (VENTOLIN HFA) 108 (90 Base) MCG/ACT inhaler Inhale 2 puffs into the lungs every 6 (six) hours as needed.  . AMBULATORY NON FORMULARY MEDICATION Medication Name: compression stockings, below the knee, 10-20 mmHg. Measure for size. Disp: 5 pairs. Refill x99 prn. Dx: venous stasis, L TKA, L DVT, peripheral vascular disease  . ascorbic acid (VITAMIN C) 500 MG tablet Vitamin C 500 mg tablet   2 tablets every day by oral route.  . Calcium Citrate (CITRACAL PO) Take by mouth.  . estradiol (ESTRACE) 1 MG tablet   . fluticasone (FLOVENT HFA) 110 MCG/ACT inhaler Inhale 2 puffs into the lungs 2 (two) times daily.  . Loratadine 10 MG CAPS Take by mouth.  Marland Kitchen MAGNESIUM PO Take by mouth daily.  . Multiple Vitamins-Minerals (MULTIVITAMIN PO) Take by mouth.  . Multiple Vitamins-Minerals (ZINC PO) Take by mouth daily.  Marland Kitchen omeprazole (PRILOSEC) 20 MG capsule Take 20 mg by mouth daily.  . rivaroxaban (XARELTO) 20 MG TABS tablet Take 1 tablet (20 mg total) by mouth daily. START on 12/14/20 and complete after 70 days (02/22/21) for total 90 days therapy for DVT  . vitamin B-12 (CYANOCOBALAMIN) 1000 MCG tablet Take 1,000 mcg by mouth daily.    Allergies  Allergen Reactions  . Morphine And Related Itching  . Sulfa Antibiotics Hives  . Morphine Hives  . Cephalexin Other (See Comments)    "funny feeling"    Patient Active Problem List   Diagnosis Date Noted  . 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 Abnormal x-ray of lung 08/12/2020  . 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 Arthritis of right hand 12/27/2018  . Osteoarthritis of carpometacarpal (CMC) joint of thumb 03/06/2018  . Umbilical hernia 93/23/5573  . Foreign body (FB) in soft tissue 07/04/2014    Family History  Problem Relation Age of Onset  . Heart attack Other        mother     Social History   Tobacco Use  Smoking Status Never Smoker  Smokeless Tobacco Never Used    Past Surgical History:  Procedure Laterality Date  . ABDOMINAL HYSTERECTOMY  1993  .  CHOLECYSTECTOMY    . HERNIA REPAIR     ventral hernia   . KNEE ARTHROSCOPY  2006  . KNEE ARTHROSCOPY  2012  . TOTAL KNEE ARTHROPLASTY  2011    Immunization History  Administered Date(s) Administered  . Fluad Quad(high Dose 65+) 09/14/2020  . Influenza,inj,Quad PF,6+ Mos 07/11/2017, 07/16/2018, 07/24/2019  . Influenza-Unspecified 07/10/2014  . PPD Test 11/25/2019  . Tdap 06/11/2016, 02/16/2019  . Zoster Recombinat (Shingrix) 07/11/2017, 09/14/2017    No results found for this or any previous visit (from the past 2160 hour(s)).  No results found.     All questions at time of visit were answered - patient instructed to contact office with any additional concerns or updates. ER/RTC precautions were reviewed with the patient as applicable.   Please note: manual typing as well as voice recognition software may have been used to produce this document - typos may escape review. Please contact Dr. Sheppard Coil for any needed clarifications.

## 2021-01-28 DIAGNOSIS — M25562 Pain in left knee: Secondary | ICD-10-CM | POA: Diagnosis not present

## 2021-01-29 ENCOUNTER — Telehealth: Payer: Self-pay

## 2021-01-29 NOTE — Telephone Encounter (Signed)
Routing to covering provider:   Pt called stating she is having a hard time purchasing the compression stockings. She is currently using short sleeve compression socks and wanted to know if that was suffice for now to use for prevention of DVT? Pls advise, thanks.

## 2021-01-29 NOTE — Telephone Encounter (Signed)
I am not sure what "short sleeve compression socks" are specifically.  If she is describing those that go from the toe to the knee, these will be helpful.  If the sock does not cover the foot and the lower leg, it may cause swelling of the foot.  Compression stockings and socks are available at most pharmacies but can also be found on [place] for fairly decent prices.  A good pair of compression socks will last for a while so spending the money for them would absolutely be worth it.  Also recommend checking with her insurance to see if they will cover compression socks or stockings because some do.

## 2021-02-01 NOTE — Telephone Encounter (Signed)
Task completed. Per pt, she has tried several pharmacies and medical equipment stores for the compression stockings. The compression stockings are around $40 dollars, and is not covered by patient's insurance. Pt is using post op stockings, which is currently helping with the swelling. Pt informed to contact the office if her symptoms should worsen or change. Pt was agreeable with plan.

## 2021-02-02 ENCOUNTER — Telehealth: Payer: Self-pay

## 2021-02-02 DIAGNOSIS — M25562 Pain in left knee: Secondary | ICD-10-CM | POA: Diagnosis not present

## 2021-02-02 NOTE — Telephone Encounter (Signed)
Pt called and asked if it would be ok to use 20-56mmHg compression stockings instead of 10-80mmHg ones. Please advise, thanks.

## 2021-02-02 NOTE — Telephone Encounter (Signed)
Yes, that should be fine.

## 2021-02-03 NOTE — Telephone Encounter (Signed)
Patient aware of Joy's response regarding changing compression stocking to 20-30. No further questions or concerns at this time.

## 2021-02-05 DIAGNOSIS — Z4789 Encounter for other orthopedic aftercare: Secondary | ICD-10-CM | POA: Diagnosis not present

## 2021-02-05 DIAGNOSIS — M25562 Pain in left knee: Secondary | ICD-10-CM | POA: Diagnosis not present

## 2021-02-09 ENCOUNTER — Other Ambulatory Visit: Payer: Self-pay | Admitting: Osteopathic Medicine

## 2021-02-09 DIAGNOSIS — M25562 Pain in left knee: Secondary | ICD-10-CM | POA: Diagnosis not present

## 2021-02-11 DIAGNOSIS — M25562 Pain in left knee: Secondary | ICD-10-CM | POA: Diagnosis not present

## 2021-02-16 DIAGNOSIS — M25562 Pain in left knee: Secondary | ICD-10-CM | POA: Diagnosis not present

## 2021-02-17 DIAGNOSIS — Z96652 Presence of left artificial knee joint: Secondary | ICD-10-CM | POA: Diagnosis not present

## 2021-02-17 DIAGNOSIS — M1712 Unilateral primary osteoarthritis, left knee: Secondary | ICD-10-CM | POA: Diagnosis not present

## 2021-02-18 DIAGNOSIS — M25562 Pain in left knee: Secondary | ICD-10-CM | POA: Diagnosis not present

## 2021-02-23 DIAGNOSIS — M25562 Pain in left knee: Secondary | ICD-10-CM | POA: Diagnosis not present

## 2021-02-24 DIAGNOSIS — H2512 Age-related nuclear cataract, left eye: Secondary | ICD-10-CM | POA: Diagnosis not present

## 2021-02-24 DIAGNOSIS — H25811 Combined forms of age-related cataract, right eye: Secondary | ICD-10-CM | POA: Diagnosis not present

## 2021-02-24 DIAGNOSIS — H52223 Regular astigmatism, bilateral: Secondary | ICD-10-CM | POA: Diagnosis not present

## 2021-02-24 DIAGNOSIS — D4981 Neoplasm of unspecified behavior of retina and choroid: Secondary | ICD-10-CM | POA: Diagnosis not present

## 2021-02-24 DIAGNOSIS — H43813 Vitreous degeneration, bilateral: Secondary | ICD-10-CM | POA: Diagnosis not present

## 2021-02-24 DIAGNOSIS — H5213 Myopia, bilateral: Secondary | ICD-10-CM | POA: Diagnosis not present

## 2021-02-24 DIAGNOSIS — H524 Presbyopia: Secondary | ICD-10-CM | POA: Diagnosis not present

## 2021-02-25 DIAGNOSIS — M25562 Pain in left knee: Secondary | ICD-10-CM | POA: Diagnosis not present

## 2021-03-01 DIAGNOSIS — M25552 Pain in left hip: Secondary | ICD-10-CM | POA: Diagnosis not present

## 2021-03-02 DIAGNOSIS — M25562 Pain in left knee: Secondary | ICD-10-CM | POA: Diagnosis not present

## 2021-03-05 DIAGNOSIS — M25552 Pain in left hip: Secondary | ICD-10-CM | POA: Diagnosis not present

## 2021-03-09 DIAGNOSIS — M25552 Pain in left hip: Secondary | ICD-10-CM | POA: Diagnosis not present

## 2021-03-10 ENCOUNTER — Other Ambulatory Visit: Payer: Self-pay

## 2021-03-10 ENCOUNTER — Ambulatory Visit (INDEPENDENT_AMBULATORY_CARE_PROVIDER_SITE_OTHER): Payer: PPO | Admitting: Osteopathic Medicine

## 2021-03-10 VITALS — BP 121/62 | HR 70 | Ht 63.0 in | Wt 190.1 lb

## 2021-03-10 DIAGNOSIS — Z0000 Encounter for general adult medical examination without abnormal findings: Secondary | ICD-10-CM

## 2021-03-10 DIAGNOSIS — Z23 Encounter for immunization: Secondary | ICD-10-CM | POA: Diagnosis not present

## 2021-03-10 NOTE — Patient Instructions (Addendum)
[place] Maintenance Summary and Written Plan of Care  [name] ,  Thank you for allowing me to perform your Medicare Annual Wellness Visit and for your ongoing commitment to your health.   Health Maintenance & Immunization History Health Maintenance  Topic Date Due  . PNA vac Low Risk Adult (1 of 2 - PCV13) 03/03/2020  . DEXA SCAN  09/21/2021 (Originally 03/03/2020)  . Fecal DNA (Cologuard)  03/10/2022 (Originally 11/15/2020)  . INFLUENZA VACCINE  05/10/2021  . MAMMOGRAM  08/19/2022  . TETANUS/TDAP  02/15/2029  . Hepatitis C Screening  Completed  . Zoster Vaccines- Shingrix  Completed  . HPV VACCINES  Aged Out  . COVID-19 Vaccine  Discontinued   Immunization History  Administered Date(s) Administered  . Fluad Quad(high Dose 65+) 09/14/2020  . Influenza,inj,Quad PF,6+ Mos 07/11/2017, 07/16/2018, 07/24/2019  . Influenza-Unspecified 07/10/2014  . PNEUMOCOCCAL CONJUGATE-20 03/10/2021  . PPD Test 11/25/2019  . Tdap 06/11/2016, 02/16/2019  . Zoster Recombinat (Shingrix) 07/11/2017, 09/14/2017    These are the patient goals that we discussed: Goals Addressed              This Visit's Progress   .  Patient Stated (pt-stated)        03/10/2021 AWV Goal: Exercise for General Health   Patient will verbalize understanding of the benefits of increased physical activity:  Exercising regularly is important. It will improve your overall fitness, flexibility, and endurance.  Regular exercise also will improve your overall health. It can help you control your weight, reduce stress, and improve your bone density.  Over the next year, patient will increase physical activity as tolerated with a goal of at least 150 minutes of moderate physical activity per week.   You can tell that you are exercising at a moderate intensity if your heart starts beating faster and you start breathing faster but can still hold a conversation.  Moderate-intensity exercise  ideas include:  Walking 1 mile (1.6 km) in about 15 minutes  Biking  Hiking  Golfing  Dancing  Water aerobics  Patient will verbalize understanding of everyday activities that increase physical activity by providing examples like the following: ? Yard work, such as: ? Pushing a Conservation officer, nature ? Raking and bagging leaves ? Washing your car ? Pushing a stroller ? Shoveling snow ? Gardening ? Washing windows or floors  Patient will be able to explain general safety guidelines for exercising:   Before you start a new exercise program, talk with your health care provider.  Do not exercise so much that you hurt yourself, feel dizzy, or get very short of breath.  Wear comfortable clothes and wear shoes with good support.  Drink plenty of water while you exercise to prevent dehydration or heat stroke.  Work out until your breathing and your heartbeat get faster.         This is a list of Health Maintenance Items that are overdue or due now: Pneumococcal vaccine  Screening mammography Bone densitometry screening Colorectal cancer screening  Mammogram- Patient will have it in November at her GYN's office. Dexa scan- Patient will have it at her GYN's office.  Colon cancer screening- Patient wants to think about the colonoscopy or the cologuard.  Orders/Referrals Placed Today: Orders Placed This Encounter  Procedures  . Pneumococcal conjugate vaccine 20-valent (Prevnar 20)   (Contact our referral department at [phone] if you have not spoken with someone about your referral appointment within the next 5 days)  Follow-up Plan . Follow-up with Emeterio Reeve, DO as planned . Medicare wellness visit in one year.  . Let us know if you change your mind about the colonoscopy.   Health Maintenance, Female Adopting a healthy lifestyle and getting preventive care are important in promoting health and wellness. Ask your health care provider about:  The right schedule  for you to have regular tests and exams.  Things you can do on your own to prevent diseases and keep yourself healthy. What should I know about diet, weight, and exercise? Eat a healthy diet  Eat a diet that includes plenty of vegetables, fruits, low-fat dairy products, and lean protein.  Do not eat a lot of foods that are high in solid fats, added sugars, or sodium.   Maintain a healthy weight Body mass index (BMI) is used to identify weight problems. It estimates body fat based on height and weight. Your health care provider can help determine your BMI and help you achieve or maintain a healthy weight. Get regular exercise Get regular exercise. This is one of the most important things you can do for your health. Most adults should:  Exercise for at least 150 minutes each week. The exercise should increase your heart rate and make you sweat (moderate-intensity exercise).  Do strengthening exercises at least twice a week. This is in addition to the moderate-intensity exercise.  Spend less time sitting. Even light physical activity can be beneficial. Watch cholesterol and blood lipids Have your blood tested for lipids and cholesterol at 66 years of age, then have this test every 5 years. Have your cholesterol levels checked more often if:  Your lipid or cholesterol levels are high.  You are older than 66 years of age.  You are at high risk for heart disease. What should I know about cancer screening? Depending on your health history and family history, you may need to have cancer screening at various ages. This may include screening for:  Breast cancer.  Cervical cancer.  Colorectal cancer.  Skin cancer.  Lung cancer. What should I know about heart disease, diabetes, and high blood pressure? Blood pressure and heart disease  High blood pressure causes heart disease and increases the risk of stroke. This is more likely to develop in people who have high blood pressure  readings, are of African descent, or are overweight.  Have your blood pressure checked: ? Every 3-5 years if you are 95-56 years of age. ? Every year if you are 41 years old or older. Diabetes Have regular diabetes screenings. This checks your fasting blood sugar level. Have the screening done:  Once every three years after age 69 if you are at a normal weight and have a low risk for diabetes.  More often and at a younger age if you are overweight or have a high risk for diabetes. What should I know about preventing infection? Hepatitis B If you have a higher risk for hepatitis B, you should be screened for this virus. Talk with your health care provider to find out if you are at risk for hepatitis B infection. Hepatitis C Testing is recommended for:  Everyone born from [birth year] through 1966.  Anyone with known risk factors for hepatitis C. Sexually transmitted infections (STIs)  Get screened for STIs, including gonorrhea and chlamydia, if: ? You are sexually active and are younger than 66 years of age. ? You are older than 66 years of age and your health care provider tells you that you are  at risk for this type of infection. ? Your sexual activity has changed since you were last screened, and you are at increased risk for chlamydia or gonorrhea. Ask your health care provider if you are at risk.  Ask your health care provider about whether you are at high risk for HIV. Your health care provider may recommend a prescription medicine to help prevent HIV infection. If you choose to take medicine to prevent HIV, you should first get tested for HIV. You should then be tested every 3 months for as long as you are taking the medicine. Pregnancy  If you are about to stop having your period (premenopausal) and you may become pregnant, seek counseling before you get pregnant.  Take 400 to 800 micrograms (mcg) of folic acid every day if you become pregnant.  Ask for birth control (contraception)  if you want to prevent pregnancy. Osteoporosis and menopause Osteoporosis is a disease in which the bones lose minerals and strength with aging. This can result in bone fractures. If you are 29 years old or older, or if you are at risk for osteoporosis and fractures, ask your health care provider if you should:  Be screened for bone loss.  Take a calcium or vitamin D supplement to lower your risk of fractures.  Be given hormone replacement therapy (HRT) to treat symptoms of menopause. Follow these instructions at home: Lifestyle  Do not use any products that contain nicotine or tobacco, such as cigarettes, e-cigarettes, and chewing tobacco. If you need help quitting, ask your health care provider.  Do not use street drugs.  Do not share needles.  Ask your health care provider for help if you need support or information about quitting drugs. Alcohol use  Do not drink alcohol if: ? Your health care provider tells you not to drink. ? You are pregnant, may be pregnant, or are planning to become pregnant.  If you drink alcohol: ? Limit how much you use to 0-1 drink a day. ? Limit intake if you are breastfeeding.  Be aware of how much alcohol is in your drink. In the [place] one drink equals one 12 oz bottle of beer (355 mL), one 5 oz glass of wine (148 mL), or one 1 oz glass of hard liquor (44 mL). General instructions  Schedule regular health, dental, and eye exams.  Stay current with your vaccines.  Tell your health care provider if: ? You often feel depressed. ? You have ever been abused or do not feel safe at home. Summary  Adopting a healthy lifestyle and getting preventive care are important in promoting health and wellness.  Follow your health care provider's instructions about healthy diet, exercising, and getting tested or screened for diseases.  Follow your health care provider's instructions on monitoring your cholesterol and blood pressure. This information is not  intended to replace advice given to you by your health care provider. Make sure you discuss any questions you have with your health care provider. Document Revised: 09/19/2018 Document Reviewed: 09/19/2018 Elsevier Patient Education  2021 [employer].

## 2021-03-10 NOTE — Progress Notes (Signed)
[facility] VISIT  03/10/2021  Subjective:  Amanda Walters is a 65 y.o. female patient of Emeterio Reeve, DO who had a Medicare Annual Wellness Visit today. Amanda Walters is Retired and lives alone. she has 0 children. she reports that she is socially active and does interact with friends/family regularly. she is minimally physically active and enjoys croteching, baking and reading.  Patient Care Team: Emeterio Reeve, DO as PCP - General (Osteopathic Medicine)  Advanced Directives 03/10/2021  Does Patient Have a Medical Advance Directive? No  Would patient like information on creating a medical advance directive? No - Patient declined    Hospital Utilization Over the Past 12 Months: # of hospitalizations or ER visits: 1 # of surgeries: 1  Review of Systems    Patient reports that her overall health is unchanged when compared to last year.  Review of Systems: History obtained from chart review and the patient  All other systems negative.  Pain Assessment Pain : No/denies pain     Current Medications & Allergies (verified) Allergies as of 03/10/2021      Reactions   Morphine And Related Itching   Sulfa Antibiotics Hives   Morphine Hives   Cephalexin Other (See Comments)   "funny feeling"      Medication List       Accurate as of March 10, 2021  1:50 PM. If you have any questions, ask your nurse or doctor.        albuterol 108 (90 Base) MCG/ACT inhaler Commonly known as: VENTOLIN HFA Inhale 2 puffs into the lungs every 6 (six) hours as needed.   AMBULATORY NON FORMULARY MEDICATION Medication Name: compression stockings, below the knee, 10-20 mmHg. Measure for size. Disp: 5 pairs. Refill x99 prn. Dx: venous stasis, L TKA, L DVT, peripheral vascular disease   ascorbic acid 500 MG tablet Commonly known as: VITAMIN C Vitamin C 500 mg tablet   2 tablets every day by oral route.   CITRACAL PO Take by mouth.   COSAMIN ASU ADVANCED FORMULA PO Take by  mouth.   estradiol 1 MG tablet Commonly known as: ESTRACE   Flovent HFA 110 MCG/ACT inhaler Generic drug: fluticasone Inhale 2 puffs into the lungs 2 (two) times daily.   fluticasone 50 MCG/ACT nasal spray Commonly known as: FLONASE Place 1 spray into both nostrils daily.   ipratropium 0.03 % nasal spray Commonly known as: ATROVENT Place 1 spray into both nostrils 2 (two) times daily as needed for rhinitis.   Loratadine 10 MG Caps Take by mouth.   MAGNESIUM PO Take by mouth daily.   meloxicam 15 MG tablet Commonly known as: MOBIC Take 15 mg by mouth daily.   MULTIVITAMIN PO Take by mouth.   omeprazole 20 MG capsule Commonly known as: PRILOSEC Take 20 mg by mouth daily.   vitamin B-12 1000 MCG tablet Commonly known as: CYANOCOBALAMIN Take 1,000 mcg by mouth daily.   Xarelto 15 MG Tabs tablet Generic drug: Rivaroxaban Take 1 tablet (15 mg total) by mouth 2 (two) times daily for 21 days.   Xarelto 20 MG Tabs tablet Generic drug: rivaroxaban TAKE 1 TABLET DAILY. START 12/14/20 AND COMPLETE AFTER 70 DAYS (02/22/21)   ZINC PO Take by mouth daily.       History (reviewed): Past Medical History:  Diagnosis Date  . COVID-19 06/05/2020  . Fever due to COVID-19 06/05/2020   Past Surgical History:  Procedure Laterality Date  . ABDOMINAL HYSTERECTOMY  1993  . CHOLECYSTECTOMY    .  HERNIA REPAIR     ventral hernia   . KNEE ARTHROSCOPY  2006  . KNEE ARTHROSCOPY  2012  . 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  Marland Kitchen TOTAL KNEE ARTHROPLASTY  2011   Family History  Problem Relation Age of Onset  . Heart attack Other        mother    Social History   Socioeconomic History  . Marital status: Single    Spouse name: Not on file  . Number of children: 0  . Years of education: 12  . Highest education level: Master's degree (e.g., MA, MS, MEng, MEd, MSW, MBA)  Occupational History  . Occupation: Editor, commissioning    Comment: Retired  Immunologist  .  Smoking status: Never Smoker  . Smokeless tobacco: Never Used  Vaping Use  . Vaping Use: Never used  Substance and Sexual Activity  . Alcohol use: Not Currently  . Drug use: Never  . Sexual activity: Not Currently  Other Topics Concern  . Not on file  Social History Narrative   Lives alone with her two dogs. She enjoys croteching, baking and reading.    Social Determinants of Health   Financial Resource Strain: Low Risk   . Difficulty of Paying Living Expenses: Not hard at all  Food Insecurity: No Food Insecurity  . Worried About Charity fundraiser in the Last Year: Never true  . Ran Out of Food in the Last Year: Never true  Transportation Needs: No Transportation Needs  . Lack of Transportation (Medical): No  . Lack of Transportation (Non-Medical): No  Physical Activity: Inactive  . Days of Exercise per Week: 0 days  . Minutes of Exercise per Session: 0 min  Stress: No Stress Concern Present  . Feeling of Stress : Not at all  Social Connections: Socially Isolated  . Frequency of Communication with Friends and Family: More than three times a week  . Frequency of Social Gatherings with Friends and Family: More than three times a week  . Attends Religious Services: Never  . Active Member of Clubs or Organizations: No  . Attends Archivist Meetings: Never  . Marital Status: Never married    Activities of Daily Living In your present state of health, do you have any difficulty performing the following activities: 03/10/2021  Hearing? N  Vision? N  Difficulty concentrating or making decisions? N  Walking or climbing stairs? N  Dressing or bathing? N  Doing errands, shopping? N  Preparing Food and eating ? N  Using the Toilet? N  In the past six months, have you accidently leaked urine? Y  Comment stress incontinence  Do you have problems with loss of bowel control? N  Managing your Medications? N  Managing your Finances? N  Housekeeping or managing your  Housekeeping? N  Some recent data might be hidden    Patient Education/Literacy How often do you need to have someone help you when you read instructions, pamphlets, or other written materials from your doctor or pharmacy?: 1 - Never What is the last grade level you completed in school?: 2 Master's degrees  Exercise Current Exercise Habits: Home exercise routine, Type of exercise: stretching;Other - see comments (bike), Time (Minutes): 45, Frequency (Times/Week): >7, Weekly Exercise (Minutes/Week): 0, Intensity: Mild, Exercise limited by: orthopedic condition(s)  Diet Patient reports consuming 2 meals a day and 2 snack(s) a day Patient reports that her primary diet is: Regular Patient reports that she does have regular  access to food.   Depression Screen PHQ 2/9 Scores 03/10/2021 11/25/2019 07/16/2018 07/11/2017  PHQ - 2 Score 0 0 0 0  PHQ- 9 Score - 0 0 -     Fall Risk Fall Risk  03/10/2021 11/25/2019  Falls in the past year? 0 0  Number falls in past yr: 0 -  Injury with Fall? 0 -  Risk for fall due to : No Fall Risks -  Follow up Falls evaluation completed -     Objective:   BP 121/62 (BP Location: Right Arm, Patient Position: Sitting, Cuff Size: Normal)   Pulse 70   Ht 5\' 3"  (1.6 m)   Wt 190 lb 1.9 oz (86.2 kg)   SpO2 98%   BMI 33.68 kg/m   Last Weight  Most recent update: 03/10/2021  1:08 PM   Weight  86.2 kg (190 lb 1.9 oz)            Body mass index is 33.68 kg/m.  Hearing/Vision  . Amanda Walters did not have difficulty with hearing/understanding during the face-to-face interview . Amanda Walters did not have difficulty with her vision during the face-to-face interview . Reports that she has had a formal eye exam by an eye care professional within the past year . Reports that she has not had a formal hearing evaluation within the past year  Cognitive Function: 6CIT Screen 03/10/2021  What Year? 0 points  What month? 0 points  What time? 0 points  Count back from 20 0 points   Months in reverse 0 points  Repeat phrase 0 points  Total Score 0    Normal Cognitive Function Screening: Yes (Normal:0-7, Significant for Dysfunction: >8)  Immunization & Health Maintenance Record Immunization History  Administered Date(s) Administered  . Fluad Quad(high Dose 65+) 09/14/2020  . Influenza,inj,Quad PF,6+ Mos 07/11/2017, 07/16/2018, 07/24/2019  . Influenza-Unspecified 07/10/2014  . PNEUMOCOCCAL CONJUGATE-20 03/10/2021  . PPD Test 11/25/2019  . Tdap 06/11/2016, 02/16/2019  . Zoster Recombinat (Shingrix) 07/11/2017, 09/14/2017    Health Maintenance  Topic Date Due  . PNA vac Low Risk Adult (1 of 2 - PCV13) 03/03/2020  . DEXA SCAN  09/21/2021 (Originally 03/03/2020)  . Fecal DNA (Cologuard)  03/10/2022 (Originally 11/15/2020)  . INFLUENZA VACCINE  05/10/2021  . MAMMOGRAM  08/19/2022  . TETANUS/TDAP  02/15/2029  . Hepatitis C Screening  Completed  . Zoster Vaccines- Shingrix  Completed  . HPV VACCINES  Aged Out  . COVID-19 Vaccine  Discontinued       Assessment  This is a routine wellness examination for Amanda Walters.  Health Maintenance: Due or Overdue Health Maintenance Due  Topic Date Due  . PNA vac Low Risk Adult (1 of 2 - PCV13) 03/03/2020    Amanda Walters does not need a referral for Community Assistance: Care Management:   no Social Work:    no Prescription Assistance:  no Nutrition/Diabetes Education:  no   Plan:  Personalized Goals Goals Addressed              This Visit's Progress   .  Patient Stated (pt-stated)        03/10/2021 AWV Goal: Exercise for General Health   Patient will verbalize understanding of the benefits of increased physical activity:  Exercising regularly is important. It will improve your overall fitness, flexibility, and endurance.  Regular exercise also will improve your overall health. It can help you control your weight, reduce stress, and improve your bone density.  Over the next year,  patient will  increase physical activity as tolerated with a goal of at least 150 minutes of moderate physical activity per week.   You can tell that you are exercising at a moderate intensity if your heart starts beating faster and you start breathing faster but can still hold a conversation.  Moderate-intensity exercise ideas include:  Walking 1 mile (1.6 km) in about 15 minutes  Biking  Hiking  Golfing  Dancing  Water aerobics  Patient will verbalize understanding of everyday activities that increase physical activity by providing examples like the following: ? Yard work, such as: ? Pushing a Conservation officer, nature ? Raking and bagging leaves ? Washing your car ? Pushing a stroller ? Shoveling snow ? Gardening ? Washing windows or floors  Patient will be able to explain general safety guidelines for exercising:   Before you start a new exercise program, talk with your health care provider.  Do not exercise so much that you hurt yourself, feel dizzy, or get very short of breath.  Wear comfortable clothes and wear shoes with good support.  Drink plenty of water while you exercise to prevent dehydration or heat stroke.  Work out until your breathing and your heartbeat get faster.       Personalized Health Maintenance & Screening Recommendations  Pneumococcal vaccine  Screening mammography Bone densitometry screening Colorectal cancer screening  Mammogram- Patient will have it in November at her GYN's office. Dexa scan- Patient will have it at her GYN's office.  Colon cancer screening- Patient wants to think about the colonoscopy or the cologuard.   Lung Cancer Screening Recommended: no (Low Dose CT Chest recommended if Age 36-80 years, 30 pack-year currently smoking OR have quit w/in past 15 years) Hepatitis C Screening recommended: no HIV Screening recommended: no  Advanced Directives: Written information was not given per the patient's request.  Referrals & Orders Orders Placed  This Encounter  Procedures  . Pneumococcal conjugate vaccine 20-valent (Prevnar 20)    Follow-up Plan . Follow-up with Emeterio Reeve, DO as planned . Medicare wellness visit in one year.  . Let us know if you change your mind about the colonoscopy.    I have personally reviewed and noted the following in the patient's chart:   . Medical and social history . Use of alcohol, tobacco or illicit drugs  . Current medications and supplements . Functional ability and status . Nutritional status . Physical activity . Advanced directives . List of other physicians . Hospitalizations, surgeries, and ER visits in previous 12 months . Vitals . Screenings to include cognitive, depression, and falls . Referrals and appointments  In addition, I have reviewed and discussed with patient certain preventive protocols, quality metrics, and best practice recommendations. A written personalized care plan for preventive services as well as general preventive health recommendations were provided to patient.     Tinnie Gens, RN  03/10/2021

## 2021-03-16 ENCOUNTER — Encounter: Payer: Self-pay | Admitting: Family Medicine

## 2021-03-16 ENCOUNTER — Ambulatory Visit (INDEPENDENT_AMBULATORY_CARE_PROVIDER_SITE_OTHER): Payer: PPO | Admitting: Family Medicine

## 2021-03-16 ENCOUNTER — Other Ambulatory Visit: Payer: Self-pay

## 2021-03-16 VITALS — BP 139/78 | HR 76 | Temp 98.1°F | Resp 17

## 2021-03-16 DIAGNOSIS — L570 Actinic keratosis: Secondary | ICD-10-CM | POA: Diagnosis not present

## 2021-03-16 NOTE — Progress Notes (Signed)
Acute Office Visit  Subjective:    Patient ID: Amanda Walters, female    DOB: March 22, 1956, 65 y.o.   MRN: [id number]  Chief Complaint  Patient presents with  . Abrasion    HPI Patient is in today for spot on left hand that is bothering her.   Several weeks ago, patient noticed a scab-like hardening of skin that was tan in color on left hand knuckles. Does not remember any cuts/injuries but does work with her hands a lot so unsure. Over the past week or so she has noticed the area has become sensitive to palpation and feels a little swollen underneath the scabbed area. She has tried picking off the scab but has not been able to. She has been applying some neosporin and peroxide with no changes. Normally she doesn't even feel it or notice it, but when she bumps it against something, she reports 8/10 pain that resolves quickly. No bleeding, drainage, erythema, generalized edema, warmth, rashes, etc.     Past Medical History:  Diagnosis Date  . COVID-19 06/05/2020  . Fever due to COVID-19 06/05/2020    Past Surgical History:  Procedure Laterality Date  . ABDOMINAL HYSTERECTOMY  1993  . CHOLECYSTECTOMY    . HERNIA REPAIR     ventral hernia   . KNEE ARTHROSCOPY  2006  . KNEE ARTHROSCOPY  2012  . 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  Marland Kitchen TOTAL KNEE ARTHROPLASTY  2011    Family History  Problem Relation Age of Onset  . Heart attack Other        mother     Social History   Socioeconomic History  . Marital status: Single    Spouse name: Not on file  . Number of children: 0  . Years of education: 46  . Highest education level: Master's degree (e.g., MA, MS, MEng, MEd, MSW, MBA)  Occupational History  . Occupation: Editor, commissioning    Comment: Retired  Immunologist  . Smoking status: Never Smoker  . Smokeless tobacco: Never Used  Vaping Use  . Vaping Use: Never used  Substance and Sexual Activity  . Alcohol use: Not Currently  . Drug use: Never  . Sexual  activity: Not Currently  Other Topics Concern  . Not on file  Social History Narrative   Lives alone with her two dogs. She enjoys croteching, baking and reading.    Social Determinants of Health   Financial Resource Strain: Low Risk   . Difficulty of Paying Living Expenses: Not hard at all  Food Insecurity: No Food Insecurity  . Worried About Charity fundraiser in the Last Year: Never true  . Ran Out of Food in the Last Year: Never true  Transportation Needs: No Transportation Needs  . Lack of Transportation (Medical): No  . Lack of Transportation (Non-Medical): No  Physical Activity: Inactive  . Days of Exercise per Week: 0 days  . Minutes of Exercise per Session: 0 min  Stress: No Stress Concern Present  . Feeling of Stress : Not at all  Social Connections: Socially Isolated  . Frequency of Communication with Friends and Family: More than three times a week  . Frequency of Social Gatherings with Friends and Family: More than three times a week  . Attends Religious Services: Never  . Active Member of Clubs or Organizations: No  . Attends Archivist Meetings: Never  . Marital Status: Never married  Intimate Partner Violence: Not At Risk  .  Fear of Current or Ex-Partner: No  . Emotionally Abused: No  . Physically Abused: No  . Sexually Abused: No    Outpatient Medications Prior to Visit  Medication Sig Dispense Refill  . albuterol (VENTOLIN HFA) 108 (90 Base) MCG/ACT inhaler Inhale 2 puffs into the lungs every 6 (six) hours as needed.    . AMBULATORY NON FORMULARY MEDICATION Medication Name: compression stockings, below the knee, 10-20 mmHg. Measure for size. Disp: 5 pairs. Refill x99 prn. Dx: venous stasis, L TKA, L DVT, peripheral vascular disease 1 Units 99  . ascorbic acid (VITAMIN C) 500 MG tablet Vitamin C 500 mg tablet   2 tablets every day by oral route.    . Calcium Citrate (CITRACAL PO) Take by mouth.    . estradiol (ESTRACE) 1 MG tablet     .  Loratadine 10 MG CAPS Take by mouth.    Marland Kitchen MAGNESIUM PO Take by mouth daily.    . Misc Natural Products (COSAMIN ASU ADVANCED FORMULA PO) Take by mouth. (Patient not taking: No sig reported)    . Multiple Vitamins-Minerals (MULTIVITAMIN PO) Take by mouth.    . Multiple Vitamins-Minerals (ZINC PO) Take by mouth daily.    Marland Kitchen omeprazole (PRILOSEC) 20 MG capsule Take 20 mg by mouth daily.    . vitamin B-12 (CYANOCOBALAMIN) 1000 MCG tablet Take 1,000 mcg by mouth daily.    . fluticasone (FLONASE) 50 MCG/ACT nasal spray Place 1 spray into both nostrils daily. (Patient not taking: No sig reported) 16 g 2  . fluticasone (FLOVENT HFA) 110 MCG/ACT inhaler Inhale 2 puffs into the lungs 2 (two) times daily. (Patient not taking: Reported on 03/10/2021) 1 each 12  . ipratropium (ATROVENT) 0.03 % nasal spray Place 1 spray into both nostrils 2 (two) times daily as needed for rhinitis. (Patient not taking: No sig reported) 30 mL 2  . meloxicam (MOBIC) 15 MG tablet Take 15 mg by mouth daily. (Patient not taking: No sig reported)    . XARELTO 15 MG TABS tablet Take 1 tablet (15 mg total) by mouth 2 (two) times daily for 21 days. (Patient not taking: Reported on 03/10/2021) 42 tablet 0  . XARELTO 20 MG TABS tablet TAKE 1 TABLET DAILY. START 12/14/20 AND COMPLETE AFTER 70 DAYS (02/22/21) (Patient not taking: Reported on 03/10/2021) 70 tablet 0   No facility-administered medications prior to visit.    Allergies  Allergen Reactions  . Morphine And Related Itching  . Sulfa Antibiotics Hives  . Morphine Hives  . Cephalexin Other (See Comments)    "funny feeling"    Review of Systems All review of systems negative except what is listed in the HPI     Objective:    Physical Exam Constitutional:      Appearance: Normal appearance.  Skin:    General: Skin is warm and dry.     Findings: Lesion present. No rash.     Comments: Keratosis-type lesion to left hand as seen in picture below, no erythema, warmth, drainage   Neurological:     Mental Status: She is alert and oriented to person, place, and time.  Psychiatric:        Mood and Affect: Mood normal.        Behavior: Behavior normal.        Thought Content: Thought content normal.        Judgment: Judgment normal.         BP 139/78   Pulse 76   Temp 98.1  F (36.7 C)   Resp 17   SpO2 98%  Wt Readings from Last 3 Encounters:  03/10/21 190 lb 1.9 oz (86.2 kg)  01/25/21 192 lb 1.9 oz (87.1 kg)  12/30/20 190 lb (86.2 kg)    Health Maintenance Due  Topic Date Due  . Pneumococcal Vaccine 2-84 Years old (1 of 4 - PCV13) Never done  . PNA vac Low Risk Adult (1 of 2 - PCV13) 03/03/2020    There are no preventive care reminders to display for this patient.   Lab Results  Component Value Date   TSH 4.82 (H) 10/13/2020   Lab Results  Component Value Date   WBC 9.4 10/13/2020   HGB 14.5 10/13/2020   HCT 43.0 10/13/2020   MCV 90.9 10/13/2020   PLT 247 10/13/2020   Lab Results  Component Value Date   NA 138 10/13/2020   K 3.9 10/13/2020   CO2 26 10/13/2020   GLUCOSE 131 10/13/2020   BUN 22 10/13/2020   CREATININE 1.06 (H) 10/13/2020   BILITOT 0.2 10/13/2020   ALKPHOS 73 02/18/2010   AST 17 10/13/2020   ALT 17 10/13/2020   PROT 6.7 10/13/2020   ALBUMIN 3.7 02/18/2010   CALCIUM 9.6 10/13/2020   Lab Results  Component Value Date   CHOL 196 11/25/2019   Lab Results  Component Value Date   HDL 49 (L) 11/25/2019   Lab Results  Component Value Date   LDLCALC 111 (H) 11/25/2019   Lab Results  Component Value Date   TRIG 250 (H) 11/25/2019   Lab Results  Component Value Date   CHOLHDL 4.0 11/25/2019   No results found for: HGBA1C     Assessment & Plan:   1. Keratosis Area similar to a keratosis without signs of infection. Reports she has been trying to leave it alone, but really wants something done about it. Cryotherapy today. Patient educated. She is scheduled to see Dermatology next month and will let them  reassess the area if it is still bothersome. Educated on signs of infection to watch for.    Cryotherapy template Procedure: Cryodestruction of: keratosis Consent obtained and verified. Time-out conducted. Noted no overlying erythema, induration, or other signs of local infection. Completed without difficulty using Cryo-Gun. Advised to call if fevers/chills, erythema, induration, drainage, or persistent bleeding.   Patient aware of signs/symptoms requiring further/urgent evaluation.  Follow-up as needed.    Terrilyn Saver, NP

## 2021-03-17 DIAGNOSIS — M25552 Pain in left hip: Secondary | ICD-10-CM | POA: Diagnosis not present

## 2021-03-19 DIAGNOSIS — Z471 Aftercare following joint replacement surgery: Secondary | ICD-10-CM | POA: Diagnosis not present

## 2021-03-19 DIAGNOSIS — Z96652 Presence of left artificial knee joint: Secondary | ICD-10-CM | POA: Diagnosis not present

## 2021-03-19 DIAGNOSIS — M25552 Pain in left hip: Secondary | ICD-10-CM | POA: Diagnosis not present

## 2021-03-23 DIAGNOSIS — D485 Neoplasm of uncertain behavior of skin: Secondary | ICD-10-CM | POA: Diagnosis not present

## 2021-03-23 DIAGNOSIS — C44629 Squamous cell carcinoma of skin of left upper limb, including shoulder: Secondary | ICD-10-CM | POA: Diagnosis not present

## 2021-03-23 DIAGNOSIS — M25552 Pain in left hip: Secondary | ICD-10-CM | POA: Diagnosis not present

## 2021-04-06 DIAGNOSIS — M25552 Pain in left hip: Secondary | ICD-10-CM | POA: Diagnosis not present

## 2021-04-08 DIAGNOSIS — M25552 Pain in left hip: Secondary | ICD-10-CM | POA: Diagnosis not present

## 2021-04-14 DIAGNOSIS — M25552 Pain in left hip: Secondary | ICD-10-CM | POA: Diagnosis not present

## 2021-04-21 DIAGNOSIS — M25552 Pain in left hip: Secondary | ICD-10-CM | POA: Diagnosis not present

## 2021-04-27 DIAGNOSIS — M25552 Pain in left hip: Secondary | ICD-10-CM | POA: Diagnosis not present

## 2021-04-29 DIAGNOSIS — L570 Actinic keratosis: Secondary | ICD-10-CM | POA: Diagnosis not present

## 2021-04-29 DIAGNOSIS — L821 Other seborrheic keratosis: Secondary | ICD-10-CM | POA: Diagnosis not present

## 2021-04-29 DIAGNOSIS — Z85828 Personal history of other malignant neoplasm of skin: Secondary | ICD-10-CM | POA: Diagnosis not present

## 2021-04-29 DIAGNOSIS — L814 Other melanin hyperpigmentation: Secondary | ICD-10-CM | POA: Diagnosis not present

## 2021-04-29 DIAGNOSIS — D1801 Hemangioma of skin and subcutaneous tissue: Secondary | ICD-10-CM | POA: Diagnosis not present

## 2021-04-29 DIAGNOSIS — L820 Inflamed seborrheic keratosis: Secondary | ICD-10-CM | POA: Diagnosis not present

## 2021-05-13 DIAGNOSIS — M25552 Pain in left hip: Secondary | ICD-10-CM | POA: Diagnosis not present

## 2021-05-28 ENCOUNTER — Telehealth: Payer: Self-pay | Admitting: Osteopathic Medicine

## 2021-05-28 DIAGNOSIS — Z1211 Encounter for screening for malignant neoplasm of colon: Secondary | ICD-10-CM

## 2021-05-28 NOTE — Telephone Encounter (Signed)
Dr Willis Modena called and stated she is getting phone calls stating she needs to have her colonoscopy done. Can you please put in a referral to [facility] so I can send it over to them. She stated her last one was 15 or so years ago.   Thank you Jenny Reichmann

## 2021-05-31 DIAGNOSIS — Z4789 Encounter for other orthopedic aftercare: Secondary | ICD-10-CM | POA: Diagnosis not present

## 2021-06-02 NOTE — Addendum Note (Signed)
Addended by: Mertha Finders on: 06/02/2021 02:43 PM   Modules accepted: Orders

## 2021-06-02 NOTE — Telephone Encounter (Signed)
Task completed. Referral was placed for patient.

## 2021-06-03 ENCOUNTER — Telehealth: Payer: Self-pay | Admitting: *Deleted

## 2021-06-03 DIAGNOSIS — Z1211 Encounter for screening for malignant neoplasm of colon: Secondary | ICD-10-CM

## 2021-06-03 NOTE — Telephone Encounter (Signed)
Referral placed for colonoscopy. 

## 2021-06-03 NOTE — Addendum Note (Signed)
Addended by: Jamesetta So on: 06/03/2021 09:22 AM   Modules accepted: Orders

## 2021-06-08 ENCOUNTER — Encounter: Payer: Self-pay | Admitting: Gastroenterology

## 2021-06-11 DIAGNOSIS — M25452 Effusion, left hip: Secondary | ICD-10-CM | POA: Diagnosis not present

## 2021-06-11 DIAGNOSIS — M7062 Trochanteric bursitis, left hip: Secondary | ICD-10-CM | POA: Diagnosis not present

## 2021-06-11 DIAGNOSIS — M6799 Unspecified disorder of synovium and tendon, multiple sites: Secondary | ICD-10-CM | POA: Diagnosis not present

## 2021-06-11 DIAGNOSIS — Z189 Retained foreign body fragments, unspecified material: Secondary | ICD-10-CM | POA: Diagnosis not present

## 2021-06-11 DIAGNOSIS — S76312A Strain of muscle, fascia and tendon of the posterior muscle group at thigh level, left thigh, initial encounter: Secondary | ICD-10-CM | POA: Diagnosis not present

## 2021-06-28 ENCOUNTER — Telehealth: Payer: Self-pay

## 2021-06-28 NOTE — Telephone Encounter (Signed)
Left voicemail for the patient to schedule an appt we have one available on 07/02/2021

## 2021-06-28 NOTE — Telephone Encounter (Signed)
Routing this question to her GI

## 2021-06-28 NOTE — Telephone Encounter (Signed)
Spoke with the patient and scheduled her to come in to the office per Dr Bryan Lemma. Chenged her appt to egd/colon on 101/03/2021

## 2021-06-28 NOTE — Telephone Encounter (Signed)
Patient left a vm msg stating she is scheduled for a colonoscopy in October. Can provider add a endoscopy procedure as well. Per patient, having a lot of GI issues. She trying to avoid being sedated twice for the procedures.

## 2021-06-28 NOTE — Telephone Encounter (Signed)
Message received from patient's primary care.  She is scheduled for screening colonoscopy with me in early October.  By patient message, she is having active GI symptoms and requesting endoscopy.  Certainly needs to be seen in the office for this to determine if endoscopy is the right next step for her.  Can you please schedule an expedited appointment with me to discuss the role of endoscopy at the same time as colonoscopy?  Thank you.

## 2021-07-01 DIAGNOSIS — M25552 Pain in left hip: Secondary | ICD-10-CM | POA: Diagnosis not present

## 2021-07-02 ENCOUNTER — Encounter: Payer: Self-pay | Admitting: Gastroenterology

## 2021-07-02 ENCOUNTER — Ambulatory Visit (INDEPENDENT_AMBULATORY_CARE_PROVIDER_SITE_OTHER): Payer: PPO | Admitting: Gastroenterology

## 2021-07-02 VITALS — BP 126/78 | HR 83 | Ht 64.0 in | Wt 204.1 lb

## 2021-07-02 DIAGNOSIS — K219 Gastro-esophageal reflux disease without esophagitis: Secondary | ICD-10-CM

## 2021-07-02 DIAGNOSIS — Z1211 Encounter for screening for malignant neoplasm of colon: Secondary | ICD-10-CM

## 2021-07-02 DIAGNOSIS — R1013 Epigastric pain: Secondary | ICD-10-CM

## 2021-07-02 DIAGNOSIS — R1319 Other dysphagia: Secondary | ICD-10-CM | POA: Diagnosis not present

## 2021-07-02 NOTE — Patient Instructions (Signed)
If you are age 66 or older, your body mass index should be between 23-30. Your Body mass index is 35.04 kg/m. If this is out of the aforementioned range listed, please consider follow up with your Primary Care Provider.  If you are age 57 or younger, your body mass index should be between 19-25. Your Body mass index is 35.04 kg/m. If this is out of the aformentioned range listed, please consider follow up with your Primary Care Provider.   __________________________________________________________  The [facility] providers would like to encourage you to use [facility] to communicate with providers for non-urgent requests or questions.  Due to long hold times on the telephone, sending your provider a message by [facility] may be a faster and more efficient way to get a response.  Please allow 48 business hours for a response.  Please remember that this is for non-urgent requests.   Due to recent changes in healthcare laws, you may see the results of your imaging and laboratory studies on MyChart before your provider has had a chance to review them.  We understand that in some cases there may be results that are confusing or concerning to you. Not all laboratory results come back in the same time frame and the provider may be waiting for multiple results in order to interpret others.  Please give [place] 48 hours in order for your provider to thoroughly review all the results before contacting the office for clarification of your results.   Thank you for choosing me and [facility].  Vito Cirigliano, D.O.

## 2021-07-02 NOTE — Progress Notes (Signed)
Chief Complaint: Colon cancer screening, dyspepsia, GERD  Referring Provider:     Emeterio Reeve, DO    HPI:     Amanda Walters is a 65 y.o. female referred to the Gastroenterology Clinic for evaluation of ongoing colon cancer screening along with evaluation of dyspepsia and reflux.  Last colonoscopy was 2007 and reportedly normal per patient.  Cologuard negative 3 years ago.  Due for ongoing CRC screening and would like to proceed with colonoscopy at this time. No recent LGI sxs.   Longstanding history of reflux for years, relatively well controlled with omeprazole 20 mg/day. Takes with breakfast. Recently with increasing breakthough sxs of indigestion, heartburn, dyspepsia. Takes Tums or Pepcid once/week for breakthrough. Very rare nocturnal sxs. Worse with butter, tomato sauce, onions. Occasional dysphagia to pills, pointing to antrerior neck.  No history of food impactions.  EGD in 2007, but unsure of results.    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   TOTAL KNEE ARTHROPLASTY  2011   Family History  Problem Relation Age of Onset   Heart attack Other        mother    Colon cancer Neg Hx    Esophageal cancer Neg Hx    Social History   Tobacco Use   Smoking status: Never   Smokeless tobacco: Never  Vaping Use   Vaping Use: Never used  Substance Use Topics   Alcohol use: Not Currently   Drug use: Never   Current Outpatient Medications  Medication Sig Dispense Refill    AMBULATORY NON FORMULARY MEDICATION Medication Name: compression stockings, below the knee, 10-20 mmHg. Measure for size. Disp: 5 pairs. Refill x99 prn. Dx: venous stasis, L TKA, L DVT, peripheral vascular disease 1 Units 99   ascorbic acid (VITAMIN C) 500 MG tablet Vitamin C 500 mg tablet   2 tablets every day by oral route.     Biotin 1000 MCG tablet Take 1,000 mcg by mouth daily.     Calcium Citrate (CITRACAL PO) Take 2 tablets by mouth daily.     Cholecalciferol (VITAMIN D3) 1.25 MG (50000 UT) CAPS Take 1 capsule by mouth daily in the afternoon.     estradiol (ESTRACE) 1 MG tablet 1.5 mg daily in the afternoon.     Loratadine 10 MG CAPS Take by mouth every morning.     MAGNESIUM PO Take 250 mg by mouth daily.     Multiple Vitamins-Minerals (MULTIVITAMIN PO) Take 2 tablets by mouth daily. One a day pedit     Multiple Vitamins-Minerals (ZINC PO) Take 50 mg by mouth daily.     omeprazole (PRILOSEC) 20 MG capsule Take 20 mg by mouth daily.     vitamin B-12 (CYANOCOBALAMIN) 1000 MCG tablet Take 1,500 mcg by mouth daily.     albuterol (VENTOLIN HFA) 108 (90 Base) MCG/ACT inhaler Inhale 2 puffs into  the lungs every 6 (six) hours as needed. (Patient not taking: Reported on 07/02/2021)     No current facility-administered medications for this visit.   Allergies  Allergen Reactions   Morphine And Related Itching   Sulfa Antibiotics Hives   Morphine Hives   Cephalexin Other (See Comments)    "funny feeling"     Review of Systems: All systems reviewed and negative except where noted in HPI.     Physical Exam:    Wt Readings from Last 3 Encounters:  07/02/21 204 lb 2 oz (92.6 kg)  03/10/21 190 lb 1.9 oz (86.2 kg)  01/25/21 192 lb 1.9 oz (87.1 kg)    BP 126/78   Pulse 83   Ht 5\' 4"  (1.626 m)   Wt 204 lb 2 oz (92.6 kg)   BMI 35.04 kg/m  Constitutional:  Pleasant, in no acute distress. Psychiatric: Normal mood and affect. Behavior is normal. Cardiovascular: Normal rate, regular  rhythm. No edema Pulmonary/chest: Effort normal and breath sounds normal. No wheezing, rales or rhonchi. Abdominal: Soft, nondistended, nontender. Bowel sounds active throughout. There are no masses palpable. No hepatomegaly. Neurological: Alert and oriented to person place and time. Skin: Skin is warm and dry. No rashes noted.   ASSESSMENT AND PLAN;   1) Colon cancer screening - Schedule for colonoscopy for ongoing CRC screening  2) GERD 3) Dysphagia to pills 4) Dyspepsia - Change timing of omeprazole to take 30-60 minutes prior to breakfast (currently taking while eating breakfast) - Continue antireflux lifestyle/dietary modifications - EGD with esophageal dilation and biopsies as appropriate - Evaluate for hiatal hernia, LES laxity, erosive esophagitis, along with PUD, gastritis, etc. at time of EGD as above  The indications, risks, and benefits of EGD and colonoscopy were explained to the patient in detail. Risks include but are not limited to bleeding, perforation, adverse reaction to medications, and cardiopulmonary compromise. Sequelae include but are not limited to the possibility of surgery, hospitalization, and mortality. The patient verbalized understanding and wished to proceed. All questions answered, referred to scheduler and bowel prep ordered. Further recommendations pending results of the exam.     Lavena Bullion, DO, FACG  07/02/2021, 3:51 PM   Emeterio Reeve, DO

## 2021-07-05 DIAGNOSIS — M25552 Pain in left hip: Secondary | ICD-10-CM | POA: Diagnosis not present

## 2021-07-08 DIAGNOSIS — M25552 Pain in left hip: Secondary | ICD-10-CM | POA: Diagnosis not present

## 2021-07-12 DIAGNOSIS — M25552 Pain in left hip: Secondary | ICD-10-CM | POA: Diagnosis not present

## 2021-07-14 DIAGNOSIS — M25552 Pain in left hip: Secondary | ICD-10-CM | POA: Diagnosis not present

## 2021-07-15 ENCOUNTER — Ambulatory Visit (AMBULATORY_SURGERY_CENTER): Payer: PPO | Admitting: Gastroenterology

## 2021-07-15 ENCOUNTER — Encounter: Payer: PPO | Admitting: Gastroenterology

## 2021-07-15 ENCOUNTER — Other Ambulatory Visit: Payer: Self-pay

## 2021-07-15 ENCOUNTER — Encounter: Payer: Self-pay | Admitting: Gastroenterology

## 2021-07-15 VITALS — BP 118/78 | HR 63 | Temp 98.0°F | Resp 16 | Ht 64.0 in | Wt 204.0 lb

## 2021-07-15 DIAGNOSIS — K641 Second degree hemorrhoids: Secondary | ICD-10-CM | POA: Diagnosis not present

## 2021-07-15 DIAGNOSIS — K5730 Diverticulosis of large intestine without perforation or abscess without bleeding: Secondary | ICD-10-CM | POA: Diagnosis not present

## 2021-07-15 DIAGNOSIS — K2950 Unspecified chronic gastritis without bleeding: Secondary | ICD-10-CM

## 2021-07-15 DIAGNOSIS — R1319 Other dysphagia: Secondary | ICD-10-CM

## 2021-07-15 DIAGNOSIS — K317 Polyp of stomach and duodenum: Secondary | ICD-10-CM

## 2021-07-15 DIAGNOSIS — K222 Esophageal obstruction: Secondary | ICD-10-CM

## 2021-07-15 DIAGNOSIS — D124 Benign neoplasm of descending colon: Secondary | ICD-10-CM

## 2021-07-15 DIAGNOSIS — K449 Diaphragmatic hernia without obstruction or gangrene: Secondary | ICD-10-CM

## 2021-07-15 DIAGNOSIS — K219 Gastro-esophageal reflux disease without esophagitis: Secondary | ICD-10-CM

## 2021-07-15 DIAGNOSIS — K2990 Gastroduodenitis, unspecified, without bleeding: Secondary | ICD-10-CM | POA: Diagnosis not present

## 2021-07-15 DIAGNOSIS — K2970 Gastritis, unspecified, without bleeding: Secondary | ICD-10-CM

## 2021-07-15 DIAGNOSIS — R1013 Epigastric pain: Secondary | ICD-10-CM

## 2021-07-15 DIAGNOSIS — Z1211 Encounter for screening for malignant neoplasm of colon: Secondary | ICD-10-CM

## 2021-07-15 MED ORDER — FLEET ENEMA 7-19 GM/118ML RE ENEM
1.0000 | ENEMA | Freq: Once | RECTAL | Status: AC
  Administered 2021-07-15: 1 via RECTAL

## 2021-07-15 MED ORDER — SODIUM CHLORIDE 0.9 % IV SOLN: 500.0000 mL | Freq: Once | INTRAVENOUS | Status: DC

## 2021-07-15 NOTE — Progress Notes (Signed)
GASTROENTEROLOGY PROCEDURE H&P NOTE   Primary Care Physician: Emeterio Reeve, DO    Reason for Procedure:   GERD, dyspepsia, colon cancer screening  Plan:    EGD, Colonoscopy  Patient is appropriate for endoscopic procedure(s) in the ambulatory ([place]) setting.  The nature of the procedure, as well as the risks, benefits, and alternatives were carefully and thoroughly reviewed with the patient. Ample time for discussion and questions allowed. The patient understood, was satisfied, and agreed to proceed.     HPI: Amanda Walters is a 65 y.o. female who presents for EGD and colonoscopy for evaluation of GERD, dyspepsia, and CRC screening.  Patient was most recently seen in the Gastroenterology Clinic on 07/02/2021 by me.  No interval change in medical history since that appointment. Please refer to that note for full details regarding GI history and clinical presentation.   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   TOTAL KNEE ARTHROPLASTY  2011    Prior to Admission medications   Medication Sig Start Date End Date Taking? Authorizing Provider  ascorbic acid (VITAMIN C) 500 MG tablet Vitamin C 500 mg tablet   2 tablets every day by oral route. 11/24/18  Yes [provider]  Biotin 1000 MCG tablet Take 1,000 mcg by mouth daily.   Yes [provider]  Calcium Citrate (CITRACAL PO) Take 2 tablets by mouth daily.   Yes [provider]  Cholecalciferol (VITAMIN D3) 1.25 MG (50000  UT) CAPS Take 1 capsule by mouth daily in the afternoon.   Yes [provider]  dexamethasone (DECADRON) 0.1 % ophthalmic suspension Apply 1 patch topically once a week. Transdermal patch applied to skin by PT   Yes [provider]  estradiol (ESTRACE) 1 MG tablet 1.5 mg daily in the afternoon. 07/02/16  Yes [provider]  Loratadine 10 MG CAPS Take by mouth every morning.   Yes [provider]  MAGNESIUM PO Take 250 mg by mouth daily.   Yes [provider]  Multiple Vitamins-Minerals (MULTIVITAMIN PO) Take 2 tablets by mouth daily. One a day pedit   Yes [provider]  Multiple Vitamins-Minerals (ZINC PO) Take 50 mg by mouth daily.   Yes [provider]  omeprazole (PRILOSEC) 20 MG capsule Take 20 mg by mouth daily.   Yes [provider]  vitamin B-12 (CYANOCOBALAMIN) 1000 MCG tablet Take 1,500 mcg by mouth daily.   Yes [provider]  albuterol (VENTOLIN HFA) 108 (90 Base) MCG/ACT inhaler Inhale 2 puffs into the lungs every 6 (six) hours as needed. Patient not taking: No sig reported 06/20/20   [provider]  AMBULATORY NON FORMULARY MEDICATION Medication Name: compression stockings, below the knee, 10-20 mmHg. Measure for size. Disp: 5 pairs. Refill x99 prn. Dx: venous stasis, L TKA, L DVT, peripheral vascular disease Patient not taking: Reported on 07/15/2021 01/25/21   Emeterio Reeve, DO    Current Outpatient  Medications  Medication Sig Dispense Refill   ascorbic acid (VITAMIN C) 500 MG tablet Vitamin C 500 mg tablet   2 tablets every day by oral route.     Biotin 1000 MCG tablet Take 1,000 mcg by mouth daily.     Calcium Citrate (CITRACAL PO) Take 2 tablets by mouth daily.     Cholecalciferol (VITAMIN D3) 1.25 MG (50000 UT) CAPS Take 1 capsule by mouth daily in the afternoon.     dexamethasone (DECADRON) 0.1 % ophthalmic suspension Apply 1 patch topically once a week. Transdermal patch applied  to skin by PT     estradiol (ESTRACE) 1 MG tablet 1.5 mg daily in the afternoon.     Loratadine 10 MG CAPS Take by mouth every morning.     MAGNESIUM PO Take 250 mg by mouth daily.     Multiple Vitamins-Minerals (MULTIVITAMIN PO) Take 2 tablets by mouth daily. One a day pedit     Multiple Vitamins-Minerals (ZINC PO) Take 50 mg by mouth daily.     omeprazole (PRILOSEC) 20 MG capsule Take 20 mg by mouth daily.     vitamin B-12 (CYANOCOBALAMIN) 1000 MCG tablet Take 1,500 mcg by mouth daily.     albuterol (VENTOLIN HFA) 108 (90 Base) MCG/ACT inhaler Inhale 2 puffs into the lungs every 6 (six) hours as needed. (Patient not taking: No sig reported)     AMBULATORY NON FORMULARY MEDICATION Medication Name: compression stockings, below the knee, 10-20 mmHg. Measure for size. Disp: 5 pairs. Refill x99 prn. Dx: venous stasis, L TKA, L DVT, peripheral vascular disease (Patient not taking: Reported on 07/15/2021) 1 Units 99   Current Facility-Administered Medications  Medication Dose Route Frequency Provider Last Rate Last Admin   0.9 %  sodium chloride infusion  500 mL Intravenous Once Rondell Frick V, DO        Allergies as of 07/15/2021 - Review Complete 07/15/2021  Allergen Reaction Noted   Morphine and related Itching 07/04/2014   Sulfa antibiotics Hives 05/29/2015   Morphine Hives 07/16/2018   Cephalexin Other (See Comments) 07/07/2014    Family History  Problem Relation Age of Onset   Heart attack Other        mother    Colon cancer Neg Hx    Esophageal cancer Neg Hx     Social History   Socioeconomic History   Marital status: Single    Spouse name: Not on file   Number of children: 0   Years of education: 22   Highest education level: Master's degree (e.g., MA, MS, MEng, MEd, MSW, MBA)  Occupational History   Occupation: EC Teacher    Comment: Retired  Tobacco Use   Smoking status: Never   Smokeless tobacco: Never  Scientific laboratory technician Use: Never used  Substance and Sexual  Activity   Alcohol use: Not Currently   Drug use: Never   Sexual activity: Not Currently  Other Topics Concern   Not on file  Social History Narrative   Lives alone with her two dogs. She enjoys croteching, baking and reading.    Social Determinants of Health   Financial Resource Strain: Low Risk    Difficulty of Paying Living Expenses: Not hard at all  Food Insecurity: No Food Insecurity   Worried About Charity fundraiser in the Last Year: Never true   [place] in the Last Year: Never true  Transportation Needs: No Transportation Needs   Lack of Transportation (Medical): No  Lack of Transportation (Non-Medical): No  Physical Activity: Inactive   Days of Exercise per Week: 0 days   Minutes of Exercise per Session: 0 min  Stress: No Stress Concern Present   Feeling of Stress : Not at all  Social Connections: Socially Isolated   Frequency of Communication with Friends and Family: More than three times a week   Frequency of Social Gatherings with Friends and Family: More than three times a week   Attends Religious Services: Never   Marine scientist or Organizations: No   Attends Music therapist: Never   Marital Status: Never married  Human resources officer Violence: Not At Risk   Fear of Current or Ex-Partner: No   Emotionally Abused: No   Physically Abused: No   Sexually Abused: No    Physical Exam: Vital signs in last 24 hours: @BP  119/72   Pulse 76   Temp 98 F (36.7 C) (Skin)   Ht 5\' 4"  (1.626 m)   Wt 204 lb (92.5 kg)   SpO2 98%   BMI 35.02 kg/m  GEN: NAD EYE: Sclerae anicteric ENT: MMM CV: Non-tachycardic Pulm: CTA b/l GI: Soft, NT/ND NEURO:  Alert & Oriented x 3   Gerrit Heck, DO [facility]   07/15/2021 11:38 AM

## 2021-07-15 NOTE — Progress Notes (Signed)
Called to room to assist during endoscopic procedure.  Patient ID and intended procedure confirmed with present staff. Received instructions for my participation in the procedure from the performing physician.  

## 2021-07-15 NOTE — Progress Notes (Signed)
Enema given as ordered. Patient now clear yellow no solid stool noted.

## 2021-07-15 NOTE — Progress Notes (Signed)
Pt's states no medical or surgical changes since previsit or office visit. VS assessed by D.T 

## 2021-07-15 NOTE — Op Note (Signed)
[place] Patient Name: Amanda Walters Procedure Date: 07/15/2021 11:37 AM MRN: [id number] Endoscopist: Gerrit Heck , MD Age: 65 Referring MD:  Date of Birth: 09-30-1956 Gender: Female Account #: [id number] Procedure:                Upper GI endoscopy Indications:              Epigastric abdominal pain, Dysphagia, Esophageal                            reflux, Dyspepsia Medicines:                Monitored Anesthesia Care Procedure:                Pre-Anesthesia Assessment:                           - Prior to the procedure, a History and Physical                            was performed, and patient medications and                            allergies were reviewed. The patient's tolerance of                            previous anesthesia was also reviewed. The risks                            and benefits of the procedure and the sedation                            options and risks were discussed with the patient.                            All questions were answered, and informed consent                            was obtained. Prior Anticoagulants: The patient has                            taken no previous anticoagulant or antiplatelet                            agents. ASA Grade Assessment: II - A patient with                            mild systemic disease. After reviewing the risks                            and benefits, the patient was deemed in                            satisfactory condition to undergo the procedure.  After obtaining informed consent, the endoscope was                            passed under direct vision. Throughout the                            procedure, the patient's blood pressure, pulse, and                            oxygen saturations were monitored continuously. The                            GIF [id number] #[id number] was introduced through the                            mouth, and advanced to the second part  of duodenum.                            The upper GI endoscopy was accomplished without                            difficulty. The patient tolerated the procedure                            well. Scope In: Scope Out: Findings:                 A non-obstructing and mild Schatzki ring was found                            in the lower third of the esophagus. A TTS dilator                            was passed through the scope. Dilation with an                            18-19-20 mm balloon dilator was performed to 20 mm.                            The dilation site was examined and showed no                            bleeding, mucosal tear or perforation. The balloon                            was reinflated to 20 mm and dragged proximally                            through the esophagus for discovery and treatment                            of subtle proximal strictures. No mucosal rent  noted. The ring was then biopsied with a cold                            forceps for fracturing of the ring. Estimated blood                            loss was minimal.                           A 2 cm hiatal hernia was present.                           The upper third of the esophagus and middle third                            of the esophagus were normal.                           A few small sessile polyps with no bleeding and no                            stigmata of recent bleeding were found in the                            gastric fundus and in the gastric body. These                            polyps were removed with a cold biopsy forceps.                            Resection and retrieval were complete. Estimated                            blood loss was minimal.                           Localized mild inflammation characterized by                            erythema was found in the gastric antrum. Biopsies                            were taken with a cold  forceps for Helicobacter                            pylori testing. Estimated blood loss was minimal.                           The examined duodenum was normal. Complications:            No immediate complications. Estimated Blood Loss:     Estimated blood loss was minimal. Impression:               - Non-obstructing and mild Schatzki ring. Dilated  with 20 mm TTS balloon then fractured with forceps.                           - 2 cm hiatal hernia.                           - Normal upper third of esophagus and middle third                            of esophagus.                           - A few gastric polyps. Resected and retrieved.                           - Gastritis. Biopsied.                           - Normal examined duodenum. Recommendation:           - Patient has a contact number available for                            emergencies. The signs and symptoms of potential                            delayed complications were discussed with the                            patient. Return to normal activities tomorrow.                            Written discharge instructions were provided to the                            patient.                           - Resume previous diet.                           - Continue present medications.                           - Await pathology results.                           - Repeat upper endoscopy PRN for retreatment.                           - Return to GI clinic PRN. Gerrit Heck, MD 07/15/2021 12:21:35 PM

## 2021-07-15 NOTE — Patient Instructions (Signed)
Handouts given for Esophageal stricture, gastritis, Hiatal Hernia, Polyps, diverticulosis and high fiber diet.  YOU HAD AN ENDOSCOPIC PROCEDURE TODAY AT [facility]:   Refer to the procedure report that was given to you for any specific questions about what was found during the examination.  If the procedure report does not answer your questions, please call your gastroenterologist to clarify.  If you requested that your care partner not be given the details of your procedure findings, then the procedure report has been included in a sealed envelope for you to review at your convenience later.  YOU SHOULD EXPECT: Some feelings of bloating in the abdomen. Passage of more gas than usual.  Walking can help get rid of the air that was put into your GI tract during the procedure and reduce the bloating. If you had a lower endoscopy (such as a colonoscopy or flexible sigmoidoscopy) you may notice spotting of blood in your stool or on the toilet paper. If you underwent a bowel prep for your procedure, you may not have a normal bowel movement for a few days.  Please Note:  You might notice some irritation and congestion in your nose or some drainage.  This is from the oxygen used during your procedure.  There is no need for concern and it should clear up in a day or so.  SYMPTOMS TO REPORT IMMEDIATELY:  Following lower endoscopy (colonoscopy or flexible sigmoidoscopy):  Excessive amounts of blood in the stool  Significant tenderness or worsening of abdominal pains  Swelling of the abdomen that is new, acute  Fever of 100F or higher  Following upper endoscopy (EGD)  Vomiting of blood or coffee ground material  New chest pain or pain under the shoulder blades  Painful or persistently difficult swallowing  New shortness of breath  Black, tarry-looking stools  For urgent or emergent issues, a gastroenterologist can be reached at any hour by calling [phone]. Do not use MyChart  messaging for urgent concerns.    DIET:  We do recommend a small meal at first, but then you may proceed to your regular diet.  Drink plenty of fluids but you should avoid alcoholic beverages for 24 hours.  ACTIVITY:  You should plan to take it easy for the rest of today and you should NOT DRIVE or use heavy machinery until tomorrow (because of the sedation medicines used during the test).    FOLLOW UP: Our staff will call the number listed on your records 48-72 hours following your procedure to check on you and address any questions or concerns that you may have regarding the information given to you following your procedure. If we do not reach you, we will leave a message.  We will attempt to reach you two times.  During this call, we will ask if you have developed any symptoms of COVID 19. If you develop any symptoms (ie: fever, flu-like symptoms, shortness of breath, cough etc.) before then, please call [phone].  If you test positive for Covid 19 in the 2 weeks post procedure, please call and report this information to [place].    If any biopsies were taken you will be contacted by phone or by letter within the next 1-3 weeks.  Please call us at [phone] if you have not heard about the biopsies in 3 weeks.    SIGNATURES/CONFIDENTIALITY: You and/or your care partner have signed paperwork which will be entered into your electronic medical record.  These signatures attest to the  fact that that the information above on your After Visit Summary has been reviewed and is understood.  Full responsibility of the confidentiality of this discharge information lies with you and/or your care-partner.

## 2021-07-15 NOTE — Op Note (Signed)
[place] Patient Name: Amanda Walters Procedure Date: 07/15/2021 11:37 AM MRN: [id number] Endoscopist: Gerrit Heck , MD Age: 65 Referring MD:  Date of Birth: 1956-05-24 Gender: Female Account #: [id number] Procedure:                Colonoscopy Indications:              Screening for colorectal malignant neoplasm (last                            colonoscopy was more than 10 years ago) Medicines:                Monitored Anesthesia Care Procedure:                Pre-Anesthesia Assessment:                           - Prior to the procedure, a History and Physical                            was performed, and patient medications and                            allergies were reviewed. The patient's tolerance of                            previous anesthesia was also reviewed. The risks                            and benefits of the procedure and the sedation                            options and risks were discussed with the patient.                            All questions were answered, and informed consent                            was obtained. Prior Anticoagulants: The patient has                            taken no previous anticoagulant or antiplatelet                            agents. ASA Grade Assessment: II - A patient with                            mild systemic disease. After reviewing the risks                            and benefits, the patient was deemed in                            satisfactory condition to undergo the procedure.  After obtaining informed consent, the colonoscope                            was passed under direct vision. Throughout the                            procedure, the patient's blood pressure, pulse, and                            oxygen saturations were monitored continuously. The                            Olympus CF-HQ190L [id number]) Colonoscope was                            introduced through the  anus and advanced to the the                            cecum, identified by appendiceal orifice and                            ileocecal valve. The colonoscopy was performed                            without difficulty. The patient tolerated the                            procedure well. The quality of the bowel                            preparation was good. The ileocecal valve,                            appendiceal orifice, and rectum were photographed. Scope In: 12:02:16 PM Scope Out: 12:14:24 PM Scope Withdrawal Time: 0 hours 10 minutes 7 seconds  Total Procedure Duration: 0 hours 12 minutes 8 seconds  Findings:                 Hemorrhoids were found on perianal exam.                           A 4 mm polyp was found in the descending colon. The                            polyp was sessile. The polyp was removed with a                            cold snare. Resection and retrieval were complete.                            Estimated blood loss was minimal.                           A few small-mouthed diverticula were found in the  sigmoid colon.                           Non-bleeding internal hemorrhoids were found during                            retroflexion. The hemorrhoids were small and Grade                            II (internal hemorrhoids that prolapse but reduce                            spontaneously).                           The exam was otherwise normal throughout the                            remainder of the colon. Complications:            No immediate complications. Estimated Blood Loss:     Estimated blood loss was minimal. Impression:               - Hemorrhoids found on perianal exam.                           - One 4 mm polyp in the descending colon, removed                            with a cold snare. Resected and retrieved.                           - Diverticulosis in the sigmoid colon.                           -  Non-bleeding internal hemorrhoids. Recommendation:           - Patient has a contact number available for                            emergencies. The signs and symptoms of potential                            delayed complications were discussed with the                            patient. Return to normal activities tomorrow.                            Written discharge instructions were provided to the                            patient.                           - Resume previous diet.                           -  Continue present medications.                           - Await pathology results.                           - Repeat colonoscopy for surveillance based on                            pathology results.                           - Return to GI office PRN.                           - Use fiber, for example Citrucel, Fibercon, Konsyl                            or Metamucil.                           - Internal hemorrhoids were noted on this study and                            may be amenable to hemorrhoid band ligation. If you                            are interested in further treatment of these                            hemorrhoids with band ligation, please contact my                            clinic to set up an appointment for evaluation and                            treatment. Gerrit Heck, MD 07/15/2021 12:24:26 PM

## 2021-07-15 NOTE — Progress Notes (Signed)
Pt stable to RR  Report to RN

## 2021-07-19 ENCOUNTER — Telehealth: Payer: Self-pay | Admitting: *Deleted

## 2021-07-19 NOTE — Telephone Encounter (Signed)
  Follow up Call-  Call back number 07/15/2021  Post procedure Call Back phone  # [phone]  Permission to leave phone message Yes  Some recent data might be hidden     Patient questions:  Do you have a fever, pain , or abdominal swelling? No. Pain Score  0 *  Have you tolerated food without any problems? Yes.    Have you been able to return to your normal activities? Yes.    Do you have any questions about your discharge instructions: Diet   No. Medications  No. Follow up visit  No.  Do you have questions or concerns about your Care? No.  Actions: * If pain score is 4 or above: No action needed, pain <4.  Have you developed a fever since your procedure? no  2.   Have you had an respiratory symptoms (SOB or cough) since your procedure? no  3.   Have you tested positive for COVID 19 since your procedure no  4.   Have you had any family members/close contacts diagnosed with the COVID 19 since your procedure?  no   If yes to any of these questions please route to Joylene John, RN and Joella Prince, RN

## 2021-07-20 DIAGNOSIS — M25552 Pain in left hip: Secondary | ICD-10-CM | POA: Diagnosis not present

## 2021-07-23 DIAGNOSIS — M25552 Pain in left hip: Secondary | ICD-10-CM | POA: Diagnosis not present

## 2021-07-27 ENCOUNTER — Encounter: Payer: Self-pay | Admitting: Gastroenterology

## 2021-07-27 DIAGNOSIS — M25552 Pain in left hip: Secondary | ICD-10-CM | POA: Diagnosis not present

## 2021-07-29 DIAGNOSIS — M25552 Pain in left hip: Secondary | ICD-10-CM | POA: Diagnosis not present

## 2021-08-23 DIAGNOSIS — Z01419 Encounter for gynecological examination (general) (routine) without abnormal findings: Secondary | ICD-10-CM | POA: Diagnosis not present

## 2021-08-23 DIAGNOSIS — Z79890 Hormone replacement therapy (postmenopausal): Secondary | ICD-10-CM | POA: Diagnosis not present

## 2021-08-23 DIAGNOSIS — Z1231 Encounter for screening mammogram for malignant neoplasm of breast: Secondary | ICD-10-CM | POA: Diagnosis not present

## 2021-08-23 DIAGNOSIS — Z6834 Body mass index (BMI) 34.0-34.9, adult: Secondary | ICD-10-CM | POA: Diagnosis not present

## 2021-08-23 DIAGNOSIS — Z124 Encounter for screening for malignant neoplasm of cervix: Secondary | ICD-10-CM | POA: Diagnosis not present

## 2021-08-23 DIAGNOSIS — Z01411 Encounter for gynecological examination (general) (routine) with abnormal findings: Secondary | ICD-10-CM | POA: Diagnosis not present

## 2021-09-20 DIAGNOSIS — M7542 Impingement syndrome of left shoulder: Secondary | ICD-10-CM | POA: Diagnosis not present

## 2021-09-20 DIAGNOSIS — S46002A Unspecified injury of muscle(s) and tendon(s) of the rotator cuff of left shoulder, initial encounter: Secondary | ICD-10-CM | POA: Diagnosis not present

## 2021-09-20 DIAGNOSIS — M25512 Pain in left shoulder: Secondary | ICD-10-CM | POA: Diagnosis not present

## 2021-11-02 ENCOUNTER — Ambulatory Visit (INDEPENDENT_AMBULATORY_CARE_PROVIDER_SITE_OTHER): Payer: PPO | Admitting: Medical-Surgical

## 2021-11-02 ENCOUNTER — Encounter: Payer: Self-pay | Admitting: Medical-Surgical

## 2021-11-02 ENCOUNTER — Other Ambulatory Visit: Payer: Self-pay

## 2021-11-02 VITALS — BP 122/71 | HR 82 | Temp 98.1°F | Ht 64.0 in | Wt 202.6 lb

## 2021-11-02 DIAGNOSIS — Z86718 Personal history of other venous thrombosis and embolism: Secondary | ICD-10-CM | POA: Diagnosis not present

## 2021-11-02 DIAGNOSIS — Z7689 Persons encountering health services in other specified circumstances: Secondary | ICD-10-CM | POA: Diagnosis not present

## 2021-11-02 DIAGNOSIS — Z96652 Presence of left artificial knee joint: Secondary | ICD-10-CM | POA: Diagnosis not present

## 2021-11-02 DIAGNOSIS — R1011 Right upper quadrant pain: Secondary | ICD-10-CM

## 2021-11-02 DIAGNOSIS — L299 Pruritus, unspecified: Secondary | ICD-10-CM | POA: Diagnosis not present

## 2021-11-02 DIAGNOSIS — B351 Tinea unguium: Secondary | ICD-10-CM

## 2021-11-02 DIAGNOSIS — G629 Polyneuropathy, unspecified: Secondary | ICD-10-CM

## 2021-11-02 MED ORDER — GABAPENTIN 100 MG PO CAPS: 100.0000 mg | ORAL_CAPSULE | Freq: Three times a day (TID) | ORAL | 3 refills | Status: DC

## 2021-11-02 NOTE — Progress Notes (Signed)
HPI with pertinent ROS:   CC: Transfer of care  HPI: Pleasant 66 year old female presenting today to transfer care to a new PCP and for complaints as follows:  Right upper quadrant abdominal pain-has been experiencing sharp, catching pains in the right upper quadrant of the abdomen near her prior surgical site.  These pains only last for a second before resolving on their own.  She has not been able to find any correlating activity or food intake that causes the pain, makes it worse, or makes it better.  This has been happening for "a while" the patient is unable to quantify a timeframe.  Her right great toenail is extremely bothersome as she appears to have a toenail fungus.  She has been trying topical over-the-counter treatments but these have not been very effective.  Calf tightness-had a knee replacement last year and was placed on Xarelto prophylactically due to a high risk of blood clots.  She is no longer on Xarelto however she would like to make sure that the intermittent calf tightness that she is experiencing is not and need for further prophylaxis.  No fever or chills.  No calf swelling, redness, tenderness, or consistent pain.  Numbness-noted when she had COVID, she developed bilateral foot numbness to the ball of her foot.  This is equal on both sides.  She mentioned this to her prior PCP and they said to "just watch it".  Today she reports it is not getting any better.  Notes that at night, her lower extremities are very bothersome as they start to itch bilaterally.  No issue with rashes.  No new exposures to prompt allergic reactions.  No symptoms throughout the day however as soon as she goes to bed, start to bother her.  I reviewed the past medical history, family history, social history, surgical history, and allergies today and no changes were needed.  Please see the problem list section below in epic for further details.   Physical exam:   General: Well Developed, well  nourished, and in no acute distress.  Neuro: Alert and oriented x3.  HEENT: Normocephalic, atraumatic.  Skin: Warm and dry.  Diffuse spider veins with some varicose veins noted to lower extremities bilaterally.  Right great toenail onychomycosis not extending to the nailbed. Cardiac: Regular rate and rhythm, no murmurs rubs or gallops, no lower extremity edema.  Respiratory: Clear to auscultation bilaterally. Not using accessory muscles, speaking in full sentences.  Impression and Recommendations:    1. Encounter to establish care Reviewed available information and discussed care concerns with patient.   2. Neuropathy Unclear etiology of numbness in her feet.  This did start with COVID so possible correlation.  Since this is not improved and her symptoms are worse at night, suggest trialing gabapentin 100 mg nightly with instructions to titrate to 300 mg nightly as tolerated.  3. Localized pruritus Feel this could be related to form of restless leg syndrome but possibly related to venous insufficiency.  Trialing gabapentin as above.  4. Onychomycosis Checking CMP to evaluate liver function.  If normal, we will start terbinafine daily for the next 12 weeks.  Advised patient that it will take months for the affected section of her toenail to grow out and for the infection to completely resolve.  Patient verbalized understanding and is agreeable to the plan. - COMPLETE METABOLIC PANEL WITH GFR  5. RUQ abdominal pain Unclear etiology.  Unable to reproduce symptoms on exam and no abnormality of the abdominal wall noted.  This could be related to her surgery, the associated scar tissue, and potential nerve involvement.  Advised patient to monitor this and if it becomes more frequent or she is able to pinpoint an activity or intake that is related, she should return for further evaluation.  6. History of DVT (deep vein thrombosis) 7. History of total left knee replacement No symptoms on  presentation today that are concerning for DVT.  No need for prophylactic Xarelto at this point.  Calf tightness is likely musculoskeletal.  Recommend regular exercise and stretching.  Also recommend compression stockings or socks to help with venous insufficiency.  Return in about 4 weeks (around 11/30/2021) for neuropathy/onychomycosis follow up. ___________________________________________ Clearnce Sorrel, DNP, APRN, FNP-BC [facility]

## 2021-11-03 LAB — COMPLETE METABOLIC PANEL WITH GFR
AG Ratio: 1.5 (calc) (ref 1.0–2.5)
ALT: 17 U/L (ref 6–29)
AST: 19 U/L (ref 10–35)
Albumin: 4.1 g/dL (ref 3.6–5.1)
Alkaline phosphatase (APISO): 60 U/L (ref 37–153)
BUN: 12 mg/dL (ref 7–25)
CO2: 28 mmol/L (ref 20–32)
Calcium: 9.4 mg/dL (ref 8.6–10.4)
Chloride: 103 mmol/L (ref 98–110)
Creat: 1.03 mg/dL (ref 0.50–1.05)
Globulin: 2.7 g/dL (calc) (ref 1.9–3.7)
Glucose, Bld: 97 mg/dL (ref 65–99)
Potassium: 4.1 mmol/L (ref 3.5–5.3)
Sodium: 139 mmol/L (ref 135–146)
Total Bilirubin: 0.3 mg/dL (ref 0.2–1.2)
Total Protein: 6.8 g/dL (ref 6.1–8.1)
eGFR: 60 mL/min/{1.73_m2} (ref >=60)

## 2021-11-03 MED ORDER — TERBINAFINE HCL 250 MG PO TABS: 250.0000 mg | ORAL_TABLET | Freq: Every day | ORAL | 0 refills | Status: AC

## 2021-11-04 NOTE — Addendum Note (Signed)
Addended bySamuel Bouche on: 11/04/2021 07:56 AM   Modules accepted: Orders

## 2021-11-17 DIAGNOSIS — Z4789 Encounter for other orthopedic aftercare: Secondary | ICD-10-CM | POA: Diagnosis not present

## 2021-11-26 ENCOUNTER — Telehealth: Payer: Self-pay | Admitting: Family Medicine

## 2021-11-26 NOTE — Telephone Encounter (Signed)
error 

## 2021-11-30 DIAGNOSIS — B351 Tinea unguium: Secondary | ICD-10-CM | POA: Diagnosis not present

## 2021-12-01 LAB — COMPLETE METABOLIC PANEL WITH GFR
AG Ratio: 1.5 (calc) (ref 1.0–2.5)
ALT: 16 U/L (ref 6–29)
AST: 18 U/L (ref 10–35)
Albumin: 4 g/dL (ref 3.6–5.1)
Alkaline phosphatase (APISO): 62 U/L (ref 37–153)
BUN: 15 mg/dL (ref 7–25)
CO2: 27 mmol/L (ref 20–32)
Calcium: 9.6 mg/dL (ref 8.6–10.4)
Chloride: 103 mmol/L (ref 98–110)
Creat: 0.91 mg/dL (ref 0.50–1.05)
Globulin: 2.7 g/dL (calc) (ref 1.9–3.7)
Glucose, Bld: 100 mg/dL — ABNORMAL HIGH (ref 65–99)
Potassium: 4.1 mmol/L (ref 3.5–5.3)
Sodium: 140 mmol/L (ref 135–146)
Total Bilirubin: 0.2 mg/dL (ref 0.2–1.2)
Total Protein: 6.7 g/dL (ref 6.1–8.1)
eGFR: 70 mL/min/{1.73_m2} (ref >=60)

## 2021-12-22 DIAGNOSIS — M25551 Pain in right hip: Secondary | ICD-10-CM | POA: Diagnosis not present

## 2021-12-30 IMAGING — DX DG CHEST 2V
2 series · 2 of 2 positions shown · non-contrast
Comparison: June 14, 2020.

CLINICAL DATA: Cough, dyspnea with exertion. Status post COVID
pneumonia.

EXAM:
CHEST - 2 VIEW

[chest pa]
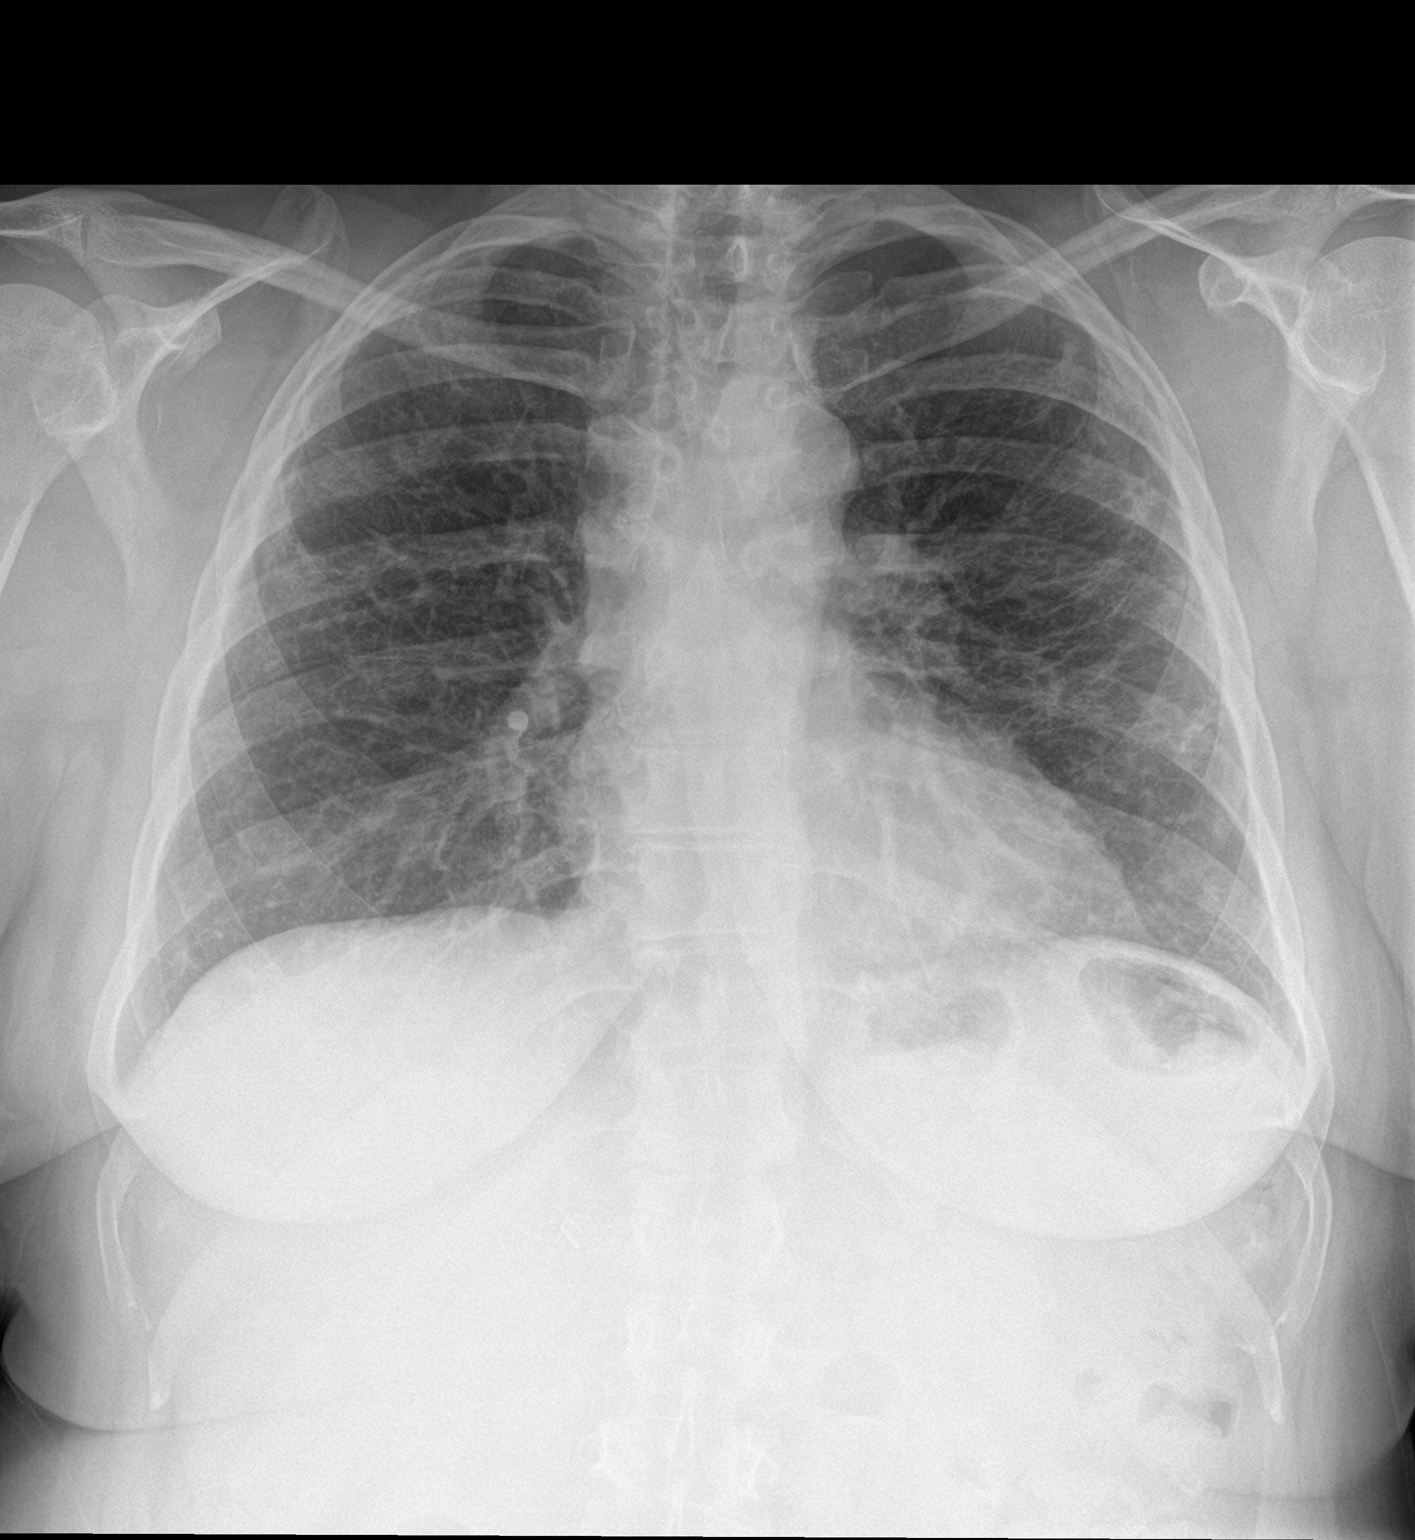

[chest lat]
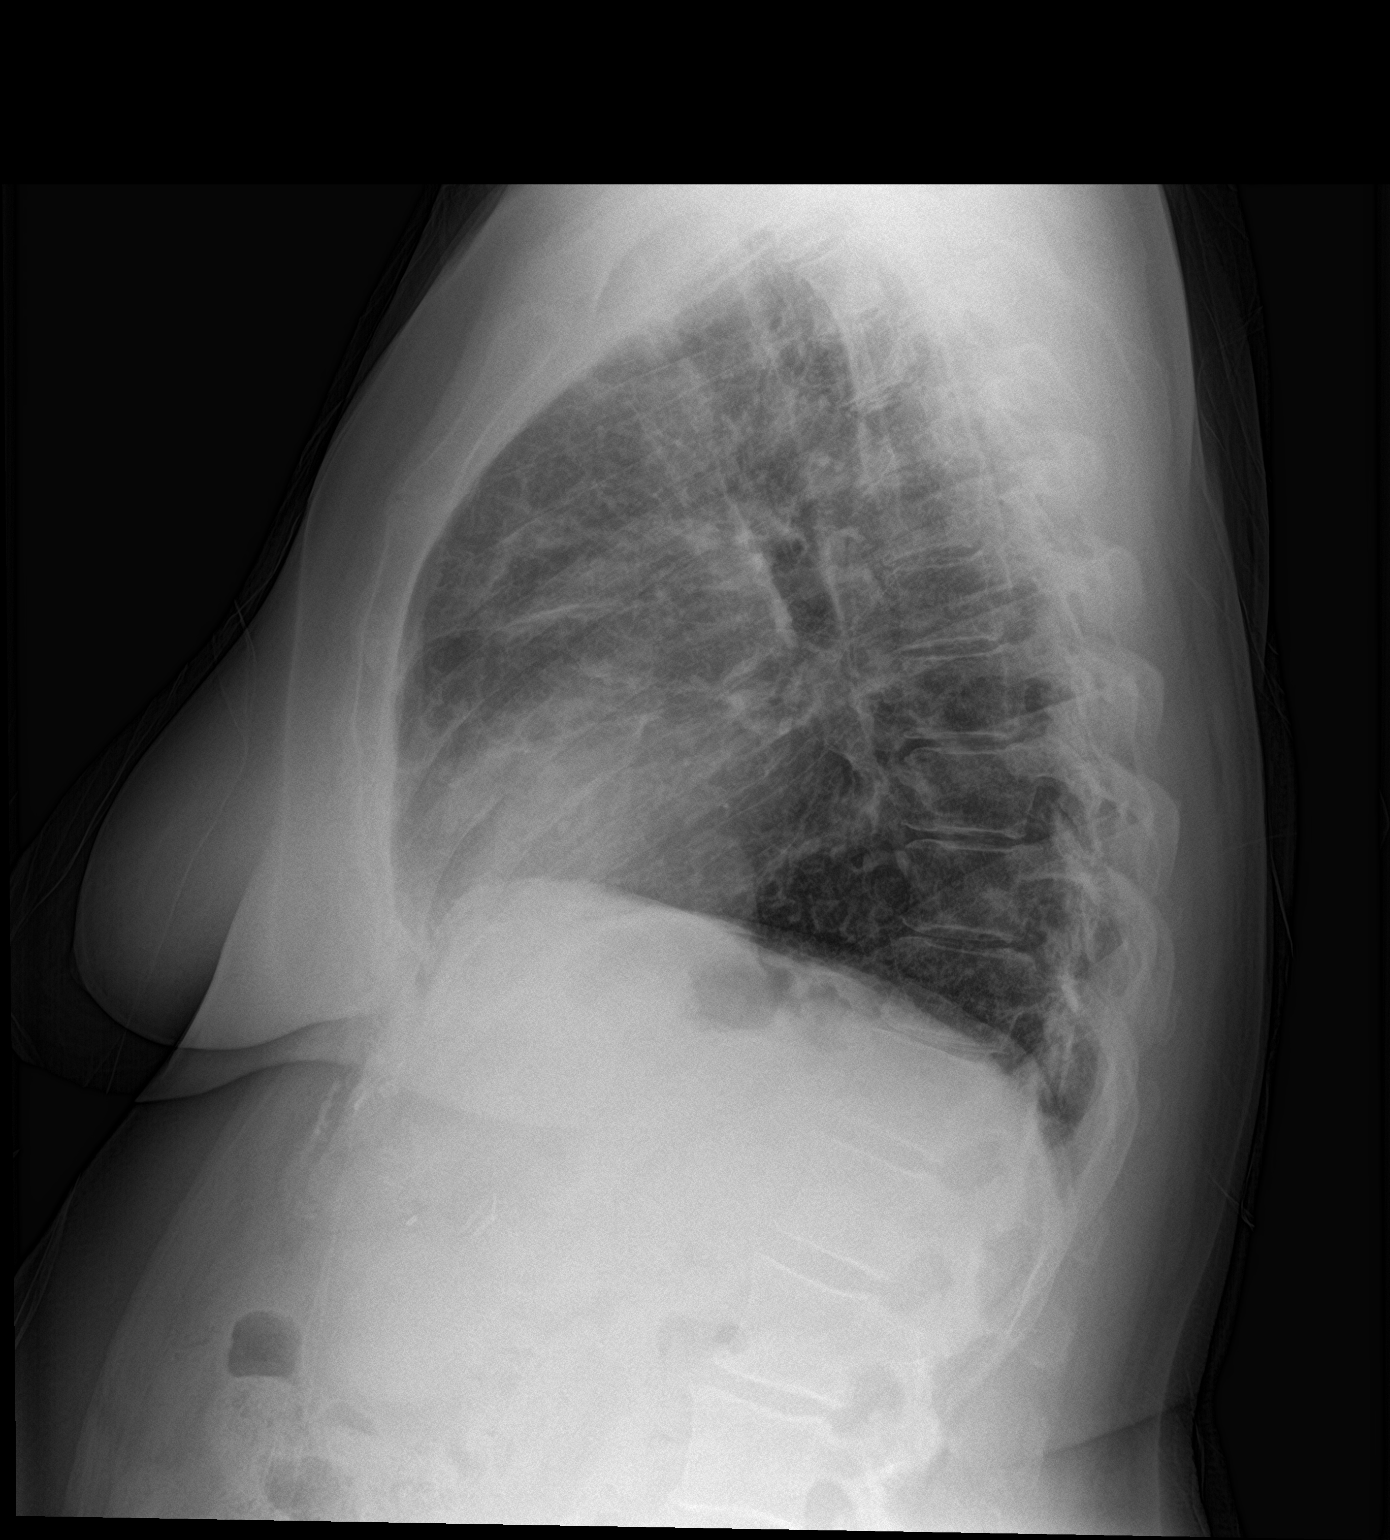

[2 of 2 positions shown; findings below may reference images not displayed]

FINDINGS: The heart size and mediastinal contours are within normal limits. No
pneumothorax or pleural effusion is noted. Multiple ill-defined
opacities are noted throughout both lungs which are significantly
decreased compared to prior exam, most consistent with residual
multifocal pneumonia or postinfectious scarring. The visualized
skeletal structures are unremarkable.
IMPRESSION: Significantly decreased multifocal pneumonia or postinfectious
scarring compared to prior exam.

## 2022-02-22 ENCOUNTER — Telehealth: Payer: Self-pay

## 2022-02-22 NOTE — Telephone Encounter (Signed)
Pt LVM on triage line and scheduling line requesting a referral to podiatrist about her feet.  Per Joy Jessup's OV note in January, pt was instructed to return in 1 month (February) for follow up of her feet.  Pt did not follow up, therefore, she was advised today by Coralee Pesa that she needs OV.  Pt given appt for 02/23/2022.  Charyl Bigger, CMA ?

## 2022-02-23 ENCOUNTER — Ambulatory Visit (INDEPENDENT_AMBULATORY_CARE_PROVIDER_SITE_OTHER): Payer: PPO | Admitting: Medical-Surgical

## 2022-02-23 ENCOUNTER — Encounter: Payer: Self-pay | Admitting: Medical-Surgical

## 2022-02-23 VITALS — BP 130/78 | HR 73 | Resp 20 | Ht 64.0 in | Wt 196.0 lb

## 2022-02-23 DIAGNOSIS — N951 Menopausal and female climacteric states: Secondary | ICD-10-CM

## 2022-02-23 DIAGNOSIS — G5762 Lesion of plantar nerve, left lower limb: Secondary | ICD-10-CM | POA: Insufficient documentation

## 2022-02-23 DIAGNOSIS — G629 Polyneuropathy, unspecified: Secondary | ICD-10-CM

## 2022-02-23 NOTE — Progress Notes (Signed)
? ?Established Patient Office Visit ? ?Subjective   ?Patient ID: Amanda Walters, female    DOB: Mar 01, 1956  Age: 66 y.o. MRN: [id number] ? ?Chief Complaint  ?Patient presents with  ? Referral  ?  Podiatry  ? ? ?HPI ?Pleasant 67 year old female presenting today to discuss a possible referral to podiatry. Having issues with numbness along the balls of her feet bilaterally. Most notable at night when in bed or when sedentary. Also has a history of left morton's neuroma between the 3rd and 4th toe that has flared up recently.  ? ?Review of Systems  ?Constitutional:  Negative for chills, fever and malaise/fatigue.  ?Respiratory:  Negative for cough, shortness of breath and wheezing.   ?Cardiovascular:  Negative for chest pain, palpitations and leg swelling.  ?Neurological:  Negative for dizziness and headaches.  ?Psychiatric/Behavioral:  Negative for depression and suicidal ideas. The patient is not nervous/anxious and does not have insomnia.   ?  ?Objective:  ?  ? ?BP 130/78 (BP Location: Left Arm, Cuff Size: Normal)   Pulse 73   Resp 20   Ht '5\' 4"'$  (1.626 m)   Wt 196 lb (88.9 kg)   SpO2 99%   BMI 33.64 kg/m?  ? ? ?Physical Exam ?Vitals reviewed.  ?Constitutional:   ?   General: She is not in acute distress. ?   Appearance: Normal appearance.  ?HENT:  ?   Head: Normocephalic and atraumatic.  ?Cardiovascular:  ?   Rate and Rhythm: Normal rate and regular rhythm.  ?   Pulses: Normal pulses.  ?   Heart sounds: Normal heart sounds. No murmur heard. ?  No friction rub. No gallop.  ?Pulmonary:  ?   Effort: Pulmonary effort is normal. No respiratory distress.  ?   Breath sounds: Normal breath sounds. No wheezing.  ?Skin: ?   General: Skin is warm and dry.  ?Neurological:  ?   Mental Status: She is alert and oriented to person, place, and time.  ?Psychiatric:     ?   Mood and Affect: Mood normal.     ?   Behavior: Behavior normal.     ?   Thought Content: Thought content normal.     ?   Judgment: Judgment normal.   ? ? ? ?No results found for any visits on 02/23/22. ? ? ? ?The 10-year ASCVD risk score (Arnett DK, et al., 2019) is: 6.6% ? ?  ?Assessment & Plan:  ? ?Problem List Items Addressed This Visit   ? ?  ? Nervous and Auditory  ? Morton's neuroma of left foot  ?  Referring to podiatry per patient request for further evaluation and treatment. ? ?  ?  ? Relevant Orders  ? Ambulatory referral to Podiatry  ? Neuropathy - Primary  ?  She has not tried gabapentin that was previously prescribed but will try this at bedtime to see if it is helpful with the neuropathy.  Referring to podiatry at her request. ? ?  ?  ? Relevant Orders  ? Ambulatory referral to Podiatry  ?  ? Other  ? Menopausal symptoms  ?  Discussed discontinuation of estradiol as she has been on this for a while for management of her menopausal symptoms.  She notes that she tried coming off of it but had significant mood swings and hot flashes.  Discussed alternative options including Effexor, Pristiq, and Cymbalta.  She will look this up and let me know if she would like to try 1  of these medications and taper off of Estrace. ? ?  ?  ? ? ?Return for [place] on 03/15/2022 as scheduled.  ?___________________________________________ ?Clearnce Sorrel, DNP, APRN, FNP-BC ?[facility] ?[place] ? ? ?

## 2022-02-23 NOTE — Assessment & Plan Note (Signed)
Discussed discontinuation of estradiol as she has been on this for a while for management of her menopausal symptoms.  She notes that she tried coming off of it but had significant mood swings and hot flashes.  Discussed alternative options including Effexor, Pristiq, and Cymbalta.  She will look this up and let me know if she would like to try 1 of these medications and taper off of Estrace. ?

## 2022-02-23 NOTE — Assessment & Plan Note (Signed)
Referring to podiatry per patient request for further evaluation and treatment. ?

## 2022-02-23 NOTE — Assessment & Plan Note (Signed)
She has not tried gabapentin that was previously prescribed but will try this at bedtime to see if it is helpful with the neuropathy.  Referring to podiatry at her request. ?

## 2022-02-28 DIAGNOSIS — H2512 Age-related nuclear cataract, left eye: Secondary | ICD-10-CM | POA: Diagnosis not present

## 2022-02-28 DIAGNOSIS — H25811 Combined forms of age-related cataract, right eye: Secondary | ICD-10-CM | POA: Diagnosis not present

## 2022-02-28 DIAGNOSIS — H5213 Myopia, bilateral: Secondary | ICD-10-CM | POA: Diagnosis not present

## 2022-02-28 DIAGNOSIS — H43813 Vitreous degeneration, bilateral: Secondary | ICD-10-CM | POA: Diagnosis not present

## 2022-03-04 ENCOUNTER — Encounter: Payer: Self-pay | Admitting: Neurology

## 2022-03-08 ENCOUNTER — Telehealth: Payer: Self-pay

## 2022-03-08 NOTE — Telephone Encounter (Signed)
Patient called wanting to put a bug in your ear because she still hasn't heard anything from Podiatry.

## 2022-03-10 ENCOUNTER — Ambulatory Visit (INDEPENDENT_AMBULATORY_CARE_PROVIDER_SITE_OTHER): Payer: PPO | Admitting: Podiatry

## 2022-03-10 ENCOUNTER — Encounter: Payer: Self-pay | Admitting: Podiatry

## 2022-03-10 ENCOUNTER — Ambulatory Visit (INDEPENDENT_AMBULATORY_CARE_PROVIDER_SITE_OTHER): Payer: PPO

## 2022-03-10 DIAGNOSIS — G5793 Unspecified mononeuropathy of bilateral lower limbs: Secondary | ICD-10-CM | POA: Diagnosis not present

## 2022-03-10 DIAGNOSIS — G629 Polyneuropathy, unspecified: Secondary | ICD-10-CM

## 2022-03-10 DIAGNOSIS — G5762 Lesion of plantar nerve, left lower limb: Secondary | ICD-10-CM | POA: Diagnosis not present

## 2022-03-10 NOTE — Progress Notes (Signed)
  Subjective:  Patient ID: Amanda Walters, female    DOB: May 11, 1956,   MRN: [id number]  Chief Complaint  Patient presents with   Numbness    BIL NEUROPATHY    66 y.o. female presents for concern of bilateral neuropathy in her feet that has been going on for two years since she had COVID. Relates numbness in the ball of her foot. Relates it is constant pain particularly when sitting. Relates she has bene given gabapentin which helps some. She states most of the irritation is numbness. Denies much burning or tingling. She also has a history of left foot neuroma and wondering if this is contributing.  . Denies any other pedal complaints. Denies n/v/f/c.   Past Medical History:  Diagnosis Date   COVID-19 06/05/2020   Fever due to COVID-19 06/05/2020   GERD (gastroesophageal reflux disease)     Objective:  Physical Exam: Vascular: DP/PT pulses 2/4 bilateral. CFT <3 seconds. Normal hair growth on digits. No edema.  Skin. No lacerations or abrasions bilateral feet.  Musculoskeletal: MMT 5/5 bilateral lower extremities in DF, PF, Inversion and Eversion. Deceased ROM in DF of ankle joint. No pain to palpation on the right. There is pain in the left third interspace on palpation and positive mulder's click. Some pain with metatarsal squeeze.  Neurological: Sensation intact to light touch.   Assessment:   1. Neuropathy   2. Morton's neuroma of left foot      Plan:  Patient was evaluated and treated and all questions answered. X-rays reviewed and discussed with patient. No acute fractures or dislocations noted.  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  Discussed neuropathy and neuroma vs radiculopathy and etiology as well as treatment with patient.  -Discussed and educated patient on oot care, especially with  regards to the vascular, neurological and musculoskeletal systems.  -Discussed supportive shoes at all times and checking feet regularly.  -Follow-up with PCP for prescription management  of gabapentin. Continue as this seem to be helping.  -Discussed neuroma pain and taking anti-inflammatories at this point for pain as needed. Does not seem to be too painful today so will defer injection.  -Patient to return as needed.    Lorenda Peck, DPM

## 2022-03-15 ENCOUNTER — Ambulatory Visit (INDEPENDENT_AMBULATORY_CARE_PROVIDER_SITE_OTHER): Payer: PPO | Admitting: Medical-Surgical

## 2022-03-15 VITALS — BP 118/72 | HR 85 | Ht 64.5 in | Wt 196.0 lb

## 2022-03-15 DIAGNOSIS — Z0000 Encounter for general adult medical examination without abnormal findings: Secondary | ICD-10-CM

## 2022-03-15 NOTE — Patient Instructions (Addendum)
[facility] Maintenance Summary and Written Plan of Care  Ms. Amanda Lopes ,  Thank you for allowing me to perform your Medicare Annual Wellness Visit and for your ongoing commitment to your health.   Health Maintenance & Immunization History Health Maintenance  Topic Date Due   DEXA SCAN  03/16/2023 (Originally 03/03/2020)   INFLUENZA VACCINE  05/10/2022   MAMMOGRAM  08/19/2022   COLONOSCOPY (Pts 45-25yr Insurance coverage will need to be confirmed)  07/15/2028   TETANUS/TDAP  02/15/2029   Pneumonia Vaccine 66 Years old  Completed   Hepatitis C Screening  Completed   Zoster Vaccines- Shingrix  Completed   HPV VACCINES  Aged Out   COVID-19 Vaccine  Discontinued   Fecal DNA (Cologuard)  Discontinued   Immunization History  Administered Date(s) Administered   Fluad Quad(high Dose 67+) 09/14/2020   Influenza,inj,Quad PF,6+ Mos 07/11/2017, 07/16/2018, 07/24/2019   Influenza-Unspecified 07/10/2014   PNEUMOCOCCAL CONJUGATE-20 03/10/2021   PPD Test 11/25/2019   Tdap 06/11/2016, 02/16/2019   Zoster Recombinat (Shingrix) 07/11/2017, 09/14/2017    These are the patient goals that we discussed:  Goals Addressed               This Visit's Progress     Patient Stated (pt-stated)        03/15/2022 AWV Goal: Improved Nutrition/Diet  Patient will verbalize understanding that diet plays an important role in overall health and that a poor diet is a risk factor for many chronic medical conditions.  Over the next year, patient will improve self management of their diet by incorporating more water and eat healthier. Patient will utilize available community resources to help with food acquisition if needed (ex: food pantries, [address], etc) Patient will work with nutrition specialist if a referral was made          This is a list of Health Maintenance Items that are overdue or due now: There are no preventive care reminders to display for this patient.     Orders/Referrals Placed Today: No orders of the defined types were placed in this encounter.  (Contact our referral department at [phone] you have not spoken with someone about your referral appointment within the next 5 days)    Follow-up Plan Follow-up with JSamuel Bouche NP as planned [facility] Medicare wellness visit in one year. AVS printed and given to the patient.    Health Maintenance, Female Adopting a healthy lifestyle and getting preventive care are important in promoting health and wellness. Ask your health care provider about: The right schedule for you to have regular tests and exams. Things you can do on your own to prevent diseases and keep yourself healthy. What should I know about diet, weight, and exercise? Eat a healthy diet  Eat a diet that includes plenty of vegetables, fruits, low-fat dairy products, and lean protein. Do not eat a lot of foods that are high in solid fats, added sugars, or sodium. Maintain a healthy weight Body mass index (BMI) is used to identify weight problems. It estimates body fat based on height and weight. Your health care provider can help determine your BMI and help you achieve or maintain a healthy weight. Get regular exercise Get regular exercise. This is one of the most important things you can do for your health. Most adults should: Exercise for at least 150 minutes each week. The exercise should increase your heart rate and make you sweat (moderate-intensity  exercise). Do strengthening exercises at least twice a week. This is in addition to the moderate-intensity exercise. Spend less time sitting. Even light physical activity can be beneficial. Watch cholesterol and blood lipids Have your blood tested for lipids and cholesterol at 67 years of age, then have this test every 5 years. Have your cholesterol levels checked more often if: Your lipid or cholesterol levels are  high. You are older than 67 years of age. You are at high risk for heart disease. What should I know about cancer screening? Depending on your health history and family history, you may need to have cancer screening at various ages. This may include screening for: Breast cancer. Cervical cancer. Colorectal cancer. Skin cancer. Lung cancer. What should I know about heart disease, diabetes, and high blood pressure? Blood pressure and heart disease High blood pressure causes heart disease and increases the risk of stroke. This is more likely to develop in people who have high blood pressure readings or are overweight. Have your blood pressure checked: Every 3-5 years if you are 25-64 years of age. Every year if you are 75 years old or older. Diabetes Have regular diabetes screenings. This checks your fasting blood sugar level. Have the screening done: Once every three years after age 73 if you are at a normal weight and have a low risk for diabetes. More often and at a younger age if you are overweight or have a high risk for diabetes. What should I know about preventing infection? Hepatitis B If you have a higher risk for hepatitis B, you should be screened for this virus. Talk with your health care provider to find out if you are at risk for hepatitis B infection. Hepatitis C Testing is recommended for: Everyone born from [birth year] through 1966. Anyone with known risk factors for hepatitis C. Sexually transmitted infections (STIs) Get screened for STIs, including gonorrhea and chlamydia, if: You are sexually active and are younger than 67 years of age. You are older than 67 years of age and your health care provider tells you that you are at risk for this type of infection. Your sexual activity has changed since you were last screened, and you are at increased risk for chlamydia or gonorrhea. Ask your health care provider if you are at risk. Ask your health care provider about whether you  are at high risk for HIV. Your health care provider may recommend a prescription medicine to help prevent HIV infection. If you choose to take medicine to prevent HIV, you should first get tested for HIV. You should then be tested every 3 months for as long as you are taking the medicine. Pregnancy If you are about to stop having your period (premenopausal) and you may become pregnant, seek counseling before you get pregnant. Take 400 to 800 micrograms (mcg) of folic acid every day if you become pregnant. Ask for birth control (contraception) if you want to prevent pregnancy. Osteoporosis and menopause Osteoporosis is a disease in which the bones lose minerals and strength with aging. This can result in bone fractures. If you are 71 years old or older, or if you are at risk for osteoporosis and fractures, ask your health care provider if you should: Be screened for bone loss. Take a calcium or vitamin D supplement to lower your risk of fractures. Be given hormone replacement therapy (HRT) to treat symptoms of menopause. Follow these instructions at home: Alcohol use Do not drink alcohol if: Your health care provider tells you  not to drink. You are pregnant, may be pregnant, or are planning to become pregnant. If you drink alcohol: Limit how much you have to: 0-1 drink a day. Know how much alcohol is in your drink. In the [place] one drink equals one 12 oz bottle of beer (355 mL), one 5 oz glass of wine (148 mL), or one 1 oz glass of hard liquor (44 mL). Lifestyle Do not use any products that contain nicotine or tobacco. These products include cigarettes, chewing tobacco, and vaping devices, such as e-cigarettes. If you need help quitting, ask your health care provider. Do not use street drugs. Do not share needles. Ask your health care provider for help if you need support or information about quitting drugs. General instructions Schedule regular health, dental, and eye exams. Stay current  with your vaccines. Tell your health care provider if: You often feel depressed. You have ever been abused or do not feel safe at home. Summary Adopting a healthy lifestyle and getting preventive care are important in promoting health and wellness. Follow your health care provider's instructions about healthy diet, exercising, and getting tested or screened for diseases. Follow your health care provider's instructions on monitoring your cholesterol and blood pressure. This information is not intended to replace advice given to you by your health care provider. Make sure you discuss any questions you have with your health care provider. Document Revised: 02/15/2021 Document Reviewed: 02/15/2021 Elsevier Patient Education  [place].

## 2022-03-15 NOTE — Progress Notes (Signed)
MEDICARE ANNUAL WELLNESS VISIT  03/15/2022  Subjective:  Amanda Walters is a 66 y.o. female patient of Samuel Bouche, NP who had a Medicare Annual Wellness Visit today. [place] is Retired and lives alone. she has 0 children. she reports that she is socially active and does interact with friends/family regularly. she is moderately physically active and enjoys reading.  Patient Care Team: Samuel Bouche, NP as PCP - General (Nurse Practitioner)     03/15/2022    1:10 PM 03/10/2021    1:22 PM  Advanced Directives  Does Patient Have a Medical Advance Directive? No No  Would patient like information on creating a medical advance directive? No - Patient declined No - Patient declined    Hospital Utilization Over the Past 12 Months: # of hospitalizations or ER visits: 0 # of surgeries: 0  Review of Systems    Patient reports that her overall health is better when compared to last year.  Review of Systems: History obtained from chart review and the patient  All other systems negative.  Pain Assessment Pain : No/denies pain     Current Medications & Allergies (verified) Allergies as of 03/15/2022       Reactions   Morphine And Related Itching   Sulfa Antibiotics Hives   Morphine Hives   Cephalexin Other (See Comments)   "funny feeling"        Medication List        Accurate as of March 15, 2022  1:32 PM. If you have any questions, ask your nurse or doctor.          ascorbic acid 500 MG tablet Commonly known as: VITAMIN C Vitamin C 500 mg tablet   2 tablets every day by oral route.   Biotin 1000 MCG tablet Take 1,000 mcg by mouth daily.   CITRACAL PO Take 2 tablets by mouth daily.   estradiol 1 MG tablet Commonly known as: ESTRACE 1.5 mg daily in the afternoon.   gabapentin 100 MG capsule Commonly known as: NEURONTIN Take 1 capsule (100 mg total) by mouth 3 (three) times daily.   Loratadine 10 MG Caps Take by mouth every morning.   MAGNESIUM PO Take 250 mg  by mouth daily.   MULTIVITAMIN PO Take 2 tablets by mouth daily. One a day pedit   omeprazole 20 MG capsule Commonly known as: PRILOSEC Take 20 mg by mouth daily.   vitamin B-12 1000 MCG tablet Commonly known as: CYANOCOBALAMIN Take 1,500 mcg by mouth daily.   Vitamin D3 1.25 MG (50000 UT) Caps Take 1 capsule by mouth daily in the afternoon.   ZINC PO Take 50 mg by mouth daily.        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   TOTAL KNEE ARTHROPLASTY  2011   Family History  Problem Relation Age of Onset   Heart attack Other        mother  Colon cancer Neg Hx    Esophageal cancer Neg Hx    Social History   Socioeconomic History   Marital status: Single    Spouse name: Not on file   Number of children: 0   Years of education: 69   Highest education level: Master's degree (e.g., MA, MS, MEng, MEd, MSW, MBA)  Occupational History   Occupation: Editor, commissioning    Comment: Retired  Tobacco Use   Smoking status: Never   Smokeless tobacco: Never  Vaping Use   Vaping Use: Never used  Substance and Sexual Activity   Alcohol use: Not Currently   Drug use: Never   Sexual activity: Not Currently  Other Topics Concern   Not on file  Social History Narrative   Lives alone with her two dogs. She enjoys croteching, baking and reading.    Social Determinants of Health   Financial Resource Strain: Low Risk    Difficulty of Paying Living Expenses: Not hard at all  Food  Insecurity: No Food Insecurity   Worried About Charity fundraiser in the Last Year: Never true   [place] in the Last Year: Never true  Transportation Needs: No Transportation Needs   Lack of Transportation (Medical): No   Lack of Transportation (Non-Medical): No  Physical Activity: Sufficiently Active   Days of Exercise per Week: 5 days   Minutes of Exercise per Session: 40 min  Stress: No Stress Concern Present   Feeling of Stress : Not at all  Social Connections: Moderately Integrated   Frequency of Communication with Friends and Family: More than three times a week   Frequency of Social Gatherings with Friends and Family: Three times a week   Attends Religious Services: More than 4 times per year   Active Member of Clubs or Organizations: No   Attends Archivist Meetings: Never   Marital Status: Married    Activities of Daily Living    03/15/2022    1:14 PM  In your present state of health, do you have any difficulty performing the following activities:  Hearing? 0  Comment ringing in ears.  Vision? 0  Difficulty concentrating or making decisions? 0  Walking or climbing stairs? 0  Dressing or bathing? 0  Doing errands, shopping? 0  Preparing Food and eating ? N  Using the Toilet? N  In the past six months, have you accidently leaked urine? Y  Comment she leaks urine all the time.  Do you have problems with loss of bowel control? N  Managing your Medications? N  Managing your Finances? N  Housekeeping or managing your Housekeeping? N    Patient Education/Literacy How often do you need to have someone help you when you read instructions, pamphlets, or other written materials from your doctor or pharmacy?: 1 - Never What is the last grade level you completed in school?: 2 Master's degrees  Exercise Current Exercise Habits: Home exercise routine, Type of exercise: Other - see comments (bike), Time (Minutes): 40, Frequency (Times/Week): 5, Weekly Exercise  (Minutes/Week): 200, Intensity: Moderate, Exercise limited by: None identified  Diet Patient reports consuming 2 meals a day and 1 snack(s) a day Patient reports that her primary diet is: Regular Patient reports that she does have regular access to food.   Depression Screen    03/15/2022    1:10 PM 02/23/2022    8:42 AM 03/10/2021    1:28 PM 11/25/2019    9:42 AM 07/16/2018    1:14  PM 07/11/2017   11:06 AM  PHQ 2/9 Scores  PHQ - 2 Score 0 0 0 0 0 0  PHQ- 9 Score    0 0      Fall Risk    03/15/2022    1:14 PM 02/23/2022    8:42 AM 03/10/2021    1:28 PM 11/25/2019    9:42 AM  Fall Risk   Falls in the past year? 0 0 0 0  Number falls in past yr: 0 0 0   Injury with Fall? 0 0 0   Risk for fall due to : No Fall Risks No Fall Risks No Fall Risks   Follow up Falls evaluation completed Falls evaluation completed Falls evaluation completed      Objective:   Ht 5' 4.5" (1.638 m)   Wt 196 lb 0.6 oz (88.9 kg)   BMI 33.13 kg/m   Last Weight  Most recent update: 03/15/2022  1:05 PM    Weight  88.9 kg (196 lb 0.6 oz)             Body mass index is 33.13 kg/m.  Hearing/Vision  Amanda Walters did not have difficulty with hearing/understanding during the face-to-face interview Amanda Walters did not have difficulty with her vision during the face-to-face interview Reports that she has had a formal eye exam by an eye care professional within the past year Reports that she has not had a formal hearing evaluation within the past year  Cognitive Function:    03/15/2022    1:17 PM 03/10/2021    1:34 PM  6CIT Screen  What Year? 0 points 0 points  What month? 0 points 0 points  What time? 0 points 0 points  Count back from 20 0 points 0 points  Months in reverse 0 points 0 points  Repeat phrase 0 points 0 points  Total Score 0 points 0 points    Normal Cognitive Function Screening: Yes (Normal:0-7, Significant for Dysfunction: >8)  Immunization & Health Maintenance Record Immunization History   Administered Date(s) Administered   Fluad Quad(high Dose 65+) 09/14/2020   Influenza,inj,Quad PF,6+ Mos 07/11/2017, 07/16/2018, 07/24/2019   Influenza-Unspecified 07/10/2014   PNEUMOCOCCAL CONJUGATE-20 03/10/2021   PPD Test 11/25/2019   Tdap 06/11/2016, 02/16/2019   Zoster Recombinat (Shingrix) 07/11/2017, 09/14/2017    Health Maintenance  Topic Date Due   DEXA SCAN  03/16/2023 (Originally 03/03/2020)   INFLUENZA VACCINE  05/10/2022   MAMMOGRAM  08/19/2022   COLONOSCOPY (Pts 45-4yr Insurance coverage will need to be confirmed)  07/15/2028   TETANUS/TDAP  02/15/2029   Pneumonia Vaccine 66 Years old  Completed   Hepatitis C Screening  Completed   Zoster Vaccines- Shingrix  Completed   HPV VACCINES  Aged Out   COVID-19 Vaccine  Discontinued   Fecal DNA (Cologuard)  Discontinued       Assessment  This is a routine wellness examination for Amanda Walters  Health Maintenance: Due or Overdue There are no preventive care reminders to display for this patient.   Amanda Skeensdoes not need a referral for Community Assistance: Care Management:   no Social Work:    no Prescription Assistance:  no Nutrition/Diabetes Education:  no   Plan:  Personalized Goals  Goals Addressed               This Visit's Progress     Patient Stated (pt-stated)        03/15/2022 AWV Goal: Improved Nutrition/Diet  Patient will verbalize understanding that diet  plays an important role in overall health and that a poor diet is a risk factor for many chronic medical conditions.  Over the next year, patient will improve self management of their diet by incorporating more water and eat healthier. Patient will utilize available community resources to help with food acquisition if needed (ex: food pantries, [address], etc) Patient will work with nutrition specialist if a referral was made        Personalized Health Maintenance & Screening Recommendations  Screening mammography Bone  densitometry screening  Patient had them done at her [place] and she will have the results faxed over.  Lung Cancer Screening Recommended: no (Low Dose CT Chest recommended if Age 34-80 years, 30 pack-year currently smoking OR have quit w/in past 15 years) Hepatitis C Screening recommended: no HIV Screening recommended: no  Advanced Directives: Written information was not given per the patient's request.  Referrals & Orders No orders of the defined types were placed in this encounter.   Follow-up Plan Follow-up with Samuel Bouche, NP as planned Please have your records from [facility] faxed over. Medicare wellness visit in one year. AVS printed and given to the patient.   I have personally reviewed and noted the following in the patient's chart:   Medical and social history Use of alcohol, tobacco or illicit drugs  Current medications and supplements Functional ability and status Nutritional status Physical activity Advanced directives List of other physicians Hospitalizations, surgeries, and ER visits in previous 12 months Vitals Screenings to include cognitive, depression, and falls Referrals and appointments  In addition, I have reviewed and discussed with patient certain preventive protocols, quality metrics, and best practice recommendations. A written personalized care plan for preventive services as well as general preventive health recommendations were provided to patient.     Tinnie Gens, RN  03/15/2022

## 2022-03-17 DIAGNOSIS — M25551 Pain in right hip: Secondary | ICD-10-CM | POA: Diagnosis not present

## 2022-05-03 DIAGNOSIS — D485 Neoplasm of uncertain behavior of skin: Secondary | ICD-10-CM | POA: Diagnosis not present

## 2022-05-03 DIAGNOSIS — L820 Inflamed seborrheic keratosis: Secondary | ICD-10-CM | POA: Diagnosis not present

## 2022-05-03 DIAGNOSIS — L570 Actinic keratosis: Secondary | ICD-10-CM | POA: Diagnosis not present

## 2022-05-03 DIAGNOSIS — L853 Xerosis cutis: Secondary | ICD-10-CM | POA: Diagnosis not present

## 2022-05-03 DIAGNOSIS — D1801 Hemangioma of skin and subcutaneous tissue: Secondary | ICD-10-CM | POA: Diagnosis not present

## 2022-05-03 DIAGNOSIS — L821 Other seborrheic keratosis: Secondary | ICD-10-CM | POA: Diagnosis not present

## 2022-05-03 DIAGNOSIS — L814 Other melanin hyperpigmentation: Secondary | ICD-10-CM | POA: Diagnosis not present

## 2022-05-03 DIAGNOSIS — Z85828 Personal history of other malignant neoplasm of skin: Secondary | ICD-10-CM | POA: Diagnosis not present

## 2022-05-03 DIAGNOSIS — D0461 Carcinoma in situ of skin of right upper limb, including shoulder: Secondary | ICD-10-CM | POA: Diagnosis not present

## 2022-07-15 ENCOUNTER — Telehealth: Payer: Self-pay

## 2022-07-15 NOTE — Telephone Encounter (Signed)
Patient called wanting to know why she gets so tired quickly. Either it's associated with long term COVID or something with her heart.  She would like an appointment or does she need to go to Urgent Care or the Emergency Room?

## 2022-07-15 NOTE — Telephone Encounter (Signed)
Amanda Walters contacted the patient and have her scheduled with Dr. Madilyn Fireman on 07/18/2022

## 2022-07-18 ENCOUNTER — Encounter: Payer: Self-pay | Admitting: Family Medicine

## 2022-07-18 ENCOUNTER — Ambulatory Visit (INDEPENDENT_AMBULATORY_CARE_PROVIDER_SITE_OTHER): Payer: PPO | Admitting: Family Medicine

## 2022-07-18 VITALS — BP 131/48 | HR 91 | Ht 64.5 in | Wt 193.0 lb

## 2022-07-18 DIAGNOSIS — R5383 Other fatigue: Secondary | ICD-10-CM | POA: Diagnosis not present

## 2022-07-18 DIAGNOSIS — H7391 Unspecified disorder of tympanic membrane, right ear: Secondary | ICD-10-CM

## 2022-07-18 DIAGNOSIS — H9201 Otalgia, right ear: Secondary | ICD-10-CM

## 2022-07-18 MED ORDER — PREDNISONE 20 MG PO TABS: 40.0000 mg | ORAL_TABLET | Freq: Every day | ORAL | 0 refills | Status: DC

## 2022-07-18 NOTE — Progress Notes (Signed)
fatigue

## 2022-07-18 NOTE — Progress Notes (Signed)
Acute Office Visit  Subjective:     Patient ID: Amanda Walters, female    DOB: 1956/05/12, 66 y.o.   MRN: [id number]  Chief Complaint  Patient presents with   Fatigue    Patient feels has had fatigue and excessive sleepiness since having covid about 2 years ago but is worse in the last several weeks    Ear Pain    Right ear x 7 to 10 days     HPI Patient is in today for fatigue.  She says since having COVID a couple years ago she just has not quite had her same energy level, but starting 2 weeks ago she had a significant increase in fatigue.  She denies any chest pain or shortness of breath.  But she has been napping almost daily and says it is a pretty hard sleep.  She reports that she sleeps well at night she gets about 8 hours she denies any blood in the urine or stool.  No bowel changes.  No fevers chills or sweats.  No abnormal weight loss.  No snoring.  Take loratadine daily for seasonal allergies.  History of thyroid disorder.  No family history of thyroid disorder.  She is also had some right ear pain for at least a week and in the last day or 2 a little bit of discomfort in the right side of her throat.  She says it feels like a fullness.  She does take loratadine daily.  ROS      Objective:    BP (!) 131/48 (BP Location: Right Arm, Patient Position: Sitting, Cuff Size: Large)   Pulse 91   Ht 5' 4.5" (1.638 m)   Wt 193 lb 0.4 oz (87.6 kg)   SpO2 97%   BMI 32.62 kg/m    Physical Exam Constitutional:      Appearance: She is well-developed.  HENT:     Head: Normocephalic and atraumatic.     Right Ear: Ear canal and external ear normal.     Left Ear: Tympanic membrane, ear canal and external ear normal.     Ears:     Comments: TM is a little bit mildly erythematous with some bulging.  No distinct fluid    Nose: Nose normal.     Mouth/Throat:     Pharynx: Oropharynx is clear.  Eyes:     Conjunctiva/sclera: Conjunctivae normal.     Pupils: Pupils are equal,  round, and reactive to light.  Neck:     Thyroid: No thyromegaly.  Cardiovascular:     Rate and Rhythm: Normal rate and regular rhythm.     Heart sounds: Normal heart sounds.  Pulmonary:     Effort: Pulmonary effort is normal.     Breath sounds: Normal breath sounds. No wheezing.  Musculoskeletal:     Cervical back: Neck supple.  Lymphadenopathy:     Cervical: No cervical adenopathy.  Skin:    General: Skin is warm and dry.  Neurological:     Mental Status: She is alert and oriented to person, place, and time.     No results found for any visits on 07/18/22.      Assessment & Plan:   Problem List Items Addressed This Visit   None Visit Diagnoses     Right ear pain    -  Primary   Relevant Medications   predniSONE (DELTASONE) 20 MG tablet   Other Relevant Orders   CBC with Differential/Platelet   TSH   COMPLETE  METABOLIC PANEL WITH GFR   B12   Other fatigue       Relevant Orders   CBC with Differential/Platelet   TSH   COMPLETE METABOLIC PANEL WITH GFR   B12   Abnormal tympanic membrane, right          Bulging on the right TM.  She does not do well with nasal sprays and she already takes loratadine daily.  We discussed options we will do a trial of prednisone and see if it is helpful.  Fatigue-unclear etiology it sounds like she is resting well and does not feel down or depressed.  She is not having any red flag symptoms such as abnormal weight loss or fevers or chills.  Certainly long-term COVID is a possibility though I do not know why it would suddenly get worse in the last couple of weeks and less she has experienced a second viral illness.  We did discuss doing some additional labs just to rule out underlying etiologies such as anemia, thyroid disorder etc.  She is agreeable to care plan.  If everything looks reassuring then encouraged her to follow back up with her PCP.  Meds ordered this encounter  Medications   predniSONE (DELTASONE) 20 MG tablet    Sig:  Take 2 tablets (40 mg total) by mouth daily with breakfast.    Dispense:  10 tablet    Refill:  0    No follow-ups on file.  Beatrice Lecher, MD

## 2022-07-19 ENCOUNTER — Encounter: Payer: Self-pay | Admitting: Family Medicine

## 2022-07-19 ENCOUNTER — Other Ambulatory Visit: Payer: Self-pay | Admitting: Family Medicine

## 2022-07-19 DIAGNOSIS — R5383 Other fatigue: Secondary | ICD-10-CM

## 2022-07-19 DIAGNOSIS — R5381 Other malaise: Secondary | ICD-10-CM

## 2022-07-19 LAB — COMPLETE METABOLIC PANEL WITH GFR
AG Ratio: 1.6 (calc) (ref 1.0–2.5)
ALT: 16 U/L (ref 6–29)
AST: 24 U/L (ref 10–35)
Albumin: 4.2 g/dL (ref 3.6–5.1)
Alkaline phosphatase (APISO): 70 U/L (ref 37–153)
BUN: 13 mg/dL (ref 7–25)
CO2: 31 mmol/L (ref 20–32)
Calcium: 9.7 mg/dL (ref 8.6–10.4)
Chloride: 102 mmol/L (ref 98–110)
Creat: 0.89 mg/dL (ref 0.50–1.05)
Globulin: 2.7 g/dL (calc) (ref 1.9–3.7)
Glucose, Bld: 95 mg/dL (ref 65–99)
Potassium: 4.3 mmol/L (ref 3.5–5.3)
Sodium: 139 mmol/L (ref 135–146)
Total Bilirubin: 0.2 mg/dL (ref 0.2–1.2)
Total Protein: 6.9 g/dL (ref 6.1–8.1)
eGFR: 71 mL/min/{1.73_m2} (ref >=60)

## 2022-07-19 LAB — CBC WITH DIFFERENTIAL/PLATELET
Absolute Monocytes: 580 cells/uL (ref 200–950)
Basophils Absolute: 50 cells/uL (ref 0–200)
Basophils Relative: 0.6 %
Eosinophils Absolute: 470 cells/uL (ref 15–500)
Eosinophils Relative: 5.6 %
HCT: 42.3 % (ref 35.0–45.0)
Hemoglobin: 14.3 g/dL (ref 11.7–15.5)
Lymphs Abs: 1672 cells/uL (ref 850–3900)
MCH: 31.6 pg (ref 27.0–33.0)
MCHC: 33.8 g/dL (ref 32.0–36.0)
MCV: 93.6 fL (ref 80.0–100.0)
MPV: 10.2 fL (ref 7.5–12.5)
Monocytes Relative: 6.9 %
Neutro Abs: 5628 cells/uL (ref 1500–7800)
Neutrophils Relative %: 67 %
Platelets: 251 10*3/uL (ref 140–400)
RBC: 4.52 10*6/uL (ref 3.80–5.10)
RDW: 12.3 % (ref 11.0–15.0)
Total Lymphocyte: 19.9 %
WBC: 8.4 10*3/uL (ref 3.8–10.8)

## 2022-07-19 LAB — TSH: TSH: 4.28 mIU/L (ref 0.40–4.50)

## 2022-07-19 LAB — VITAMIN B12: Vitamin B-12: >2000 pg/mL — ABNORMAL HIGH (ref 200–1100)

## 2022-07-19 NOTE — Progress Notes (Signed)
HI Amanda Walters.  Overall your labs look reassuring.  Neck step would be to consider doing something like a cardiac stress test just to evaluate your heart I think that would be reasonable.  Let us know if you would like to move forward with that.

## 2022-07-25 MED ORDER — AZITHROMYCIN 250 MG PO TABS: ORAL_TABLET | ORAL | 0 refills | Status: AC

## 2022-07-25 NOTE — Addendum Note (Signed)
Addended by: Beatrice Lecher D on: 07/25/2022 08:11 AM   Modules accepted: Orders

## 2022-08-09 ENCOUNTER — Encounter: Payer: Self-pay | Admitting: Cardiovascular Disease

## 2022-08-09 ENCOUNTER — Ambulatory Visit: Payer: PPO | Attending: Cardiovascular Disease | Admitting: Cardiovascular Disease

## 2022-08-09 DIAGNOSIS — R5383 Other fatigue: Secondary | ICD-10-CM | POA: Diagnosis not present

## 2022-08-09 DIAGNOSIS — Z8249 Family history of ischemic heart disease and other diseases of the circulatory system: Secondary | ICD-10-CM

## 2022-08-09 NOTE — Progress Notes (Signed)
08/09/2022 Amanda Walters   16-Jun-1956  [id number]  Primary Physician Amanda Bouche, NP Primary Cardiologist: Amanda Harp MD Amanda Walters, [place], [place]  HPI:  Amanda Walters is a 66 y.o.   Mild to moderately overweight single Caucasian female with no children was referred by Dr. Madilyn Walters, her PCP, for fatigue.  She did have 2 dogs but unfortunately had she had to put 1 down recently.  She is a retired Mudlogger having stopped teaching 7 years ago.  She otherwise has no cardiac risk factors other than a mother who died age 62 of myocardial infarction.  She has had bilateral total knee replacements.  She is completely asymptomatic other than from fatigue which she has had for the last several months.  She did have COVID back in 2021 but complete recovered from that.  She actually split a cord of wood last week without symptoms.  Current Meds  Medication Sig   amoxicillin (AMOXIL) 500 MG capsule TAKE 4 CAPSULES BY MOUTH 1 HOUR PRIOR TO APPOINTMENT   ascorbic acid (VITAMIN C) 500 MG tablet Vitamin C 500 mg tablet   2 tablets every day by oral route.   Biotin 1000 MCG tablet Take 1,000 mcg by mouth daily.   Calcium Citrate (CITRACAL PO) Take 2 tablets by mouth daily.   Cholecalciferol (VITAMIN D3) 125 MCG (5000 UT) CAPS Take 5,000 Units by mouth daily.   estradiol (ESTRACE) 1 MG tablet 1.5 mg daily in the afternoon.   fluorouracil (EFUDEX) 5 % cream SMARTSIG:1 sparingly Topical As Directed   gabapentin (NEURONTIN) 100 MG capsule Take 1 capsule (100 mg total) by mouth 3 (three) times daily.   Loratadine 10 MG CAPS Take by mouth every morning.   MAGNESIUM PO Take 250 mg by mouth daily.   methocarbamol (ROBAXIN) 500 MG tablet Take 1 tablet by mouth every 8 (eight) hours.   Multiple Vitamins-Minerals (MULTIVITAMIN PO) Take 2 tablets by mouth daily. One a day pedit   Multiple Vitamins-Minerals (ZINC PO) Take 50 mg by mouth daily.   omeprazole (PRILOSEC) 20 MG capsule  Take 20 mg by mouth daily.   vitamin B-12 (CYANOCOBALAMIN) 1000 MCG tablet Take 1,500 mcg by mouth daily.   [DISCONTINUED] Cholecalciferol (VITAMIN D3) 1.25 MG (50000 UT) CAPS Take 1 capsule by mouth daily in the afternoon.   [DISCONTINUED] predniSONE (DELTASONE) 20 MG tablet Take 2 tablets (40 mg total) by mouth daily with breakfast.     Allergies  Allergen Reactions   Misc. Sulfonamide Containing Compounds Hives   Morphine And Related Itching   Sulfa Antibiotics Hives   Morphine Hives   Cephalexin Other (See Comments)    "funny feeling"    Social History   Socioeconomic History   Marital status: Single    Spouse name: Not on file   Number of children: 0   Years of education: 51   Highest education level: Master's degree (e.g., MA, MS, MEng, MEd, MSW, MBA)  Occupational History   Occupation: Editor, commissioning    Comment: Retired  Tobacco Use   Smoking status: Never   Smokeless tobacco: Never  Vaping Use   Vaping Use: Never used  Substance and Sexual Activity   Alcohol use: Not Currently   Drug use: Never   Sexual activity: Not Currently  Other Topics Concern   Not on file  Social History Narrative   Lives alone with her two dogs. She enjoys croteching, baking and reading.    Social  Determinants of Health   Financial Resource Strain: Low Risk  (03/15/2022)   Overall Financial Resource Strain (CARDIA)    Difficulty of Paying Living Expenses: Not hard at all  Food Insecurity: No Food Insecurity (03/15/2022)   Hunger Vital Sign    Worried About Running Out of Food in the Last Year: Never true    Ran Out of Food in the Last Year: Never true  Transportation Needs: No Transportation Needs (03/15/2022)   PRAPARE - Hydrologist (Medical): No    Lack of Transportation (Non-Medical): No  Physical Activity: Sufficiently Active (03/15/2022)   Exercise Vital Sign    Days of Exercise per Week: 5 days    Minutes of Exercise per Session: 40 min  Stress: No  Stress Concern Present (03/15/2022)   [place]    Feeling of Stress : Not at all  Social Connections: Moderately Integrated (03/15/2022)   Social Connection and Isolation Panel [NHANES]    Frequency of Communication with Friends and Family: More than three times a week    Frequency of Social Gatherings with Friends and Family: Three times a week    Attends Religious Services: More than 4 times per year    Active Member of Clubs or Organizations: No    Attends Archivist Meetings: Never    Marital Status: Married  Human resources officer Violence: Not At Risk (03/15/2022)   Humiliation, Afraid, Rape, and Kick questionnaire    Fear of Current or Ex-Partner: No    Emotionally Abused: No    Physically Abused: No    Sexually Abused: No     Review of Systems: General: negative for chills, fever, night sweats or weight changes.  Cardiovascular: negative for chest pain, dyspnea on exertion, edema, orthopnea, palpitations, paroxysmal nocturnal dyspnea or shortness of breath Dermatological: negative for rash Respiratory: negative for cough or wheezing Urologic: negative for hematuria Abdominal: negative for nausea, vomiting, diarrhea, bright red blood per rectum, melena, or hematemesis Neurologic: negative for visual changes, syncope, or dizziness All other systems reviewed and are otherwise negative except as noted above.    Blood pressure 128/66, pulse 72, height 5' 4.5" (1.638 m), weight 195 lb (88.5 kg).  General appearance: alert and no distress Neck: no adenopathy, no carotid bruit, no JVD, supple, symmetrical, trachea midline, and thyroid not enlarged, symmetric, no tenderness/mass/nodules Lungs: clear to auscultation bilaterally Heart: regular rate and rhythm, S1, S2 normal, no murmur, click, rub or gallop Extremities: extremities normal, atraumatic, no cyanosis or edema Pulses: 2+ and symmetric Skin: Skin color,  texture, turgor normal. No rashes or lesions Neurologic: Grossly normal  EKG sinus rhythm at 72 with poor R wave progression and left axis deviation.  Personally reviewed this EKG.  ASSESSMENT AND PLAN:   Family history of heart disease Mother died of a myocardial infarction at age 67.  Fatigue [name] was referred by Dr. Madilyn Walters for fatigue.  She has no cardiac risk factors other than family history with a mother that died of a heart attack at age 21.  She is very active and just split a quart of wood last week by herself.  She denies chest pain or shortness of breath.  I am going to get a 2D echo and a coronary calcium score to further evaluate.     Amanda Harp MD FACP,FACC,FAHA, [facility] 08/09/2022 10:14 AM

## 2022-08-09 NOTE — Assessment & Plan Note (Signed)
Mother died of a myocardial infarction at age 67.

## 2022-08-09 NOTE — Patient Instructions (Signed)
Medication Instructions:  Your physician recommends that you continue on your current medications as directed. Please refer to the Current Medication list given to you today.  *If you need a refill on your cardiac medications before your next appointment, please call your pharmacy*   Lab Work: Your physician recommends that you return for lab work in: the next week or 2 for FASTING lipid/liver panel  If you have labs (blood work) drawn today and your tests are completely normal, you will receive your results only by: [place] (if you have MyChart) OR A paper copy in the mail If you have any lab test that is abnormal or we need to change your treatment, we will call you to review the results.   Testing/Procedures: Your physician has requested that you have an echocardiogram. Echocardiography is a painless test that uses sound waves to create images of your heart. It provides your doctor with information about the size and shape of your heart and how well your heart's chambers and valves are working. This procedure takes approximately one hour. There are no restrictions for this procedure. Please do NOT wear cologne, perfume, aftershave, or lotions (deodorant is allowed). Please arrive 15 minutes prior to your appointment time. This procedure will be done at [facility], 2nd Floor    Dr. Gwenlyn Found has ordered a CT coronary calcium score.   Test locations:   [employer]  [facility], 2nd Floor)  This is $99 out of pocket.   Coronary CalciumScan A coronary calcium scan is an imaging test used to look for deposits of calcium and other fatty materials (plaques) in the inner lining of the blood vessels of the heart (coronary arteries). These deposits of calcium and plaques can partly clog and narrow the coronary arteries without producing any symptoms or warning signs. This puts a person at risk for a heart attack. This test can detect these deposits  before symptoms develop. Tell a health care provider about: Any allergies you have. All medicines you are taking, including vitamins, herbs, eye drops, creams, and over-the-counter medicines. Any problems you or family members have had with anesthetic medicines. Any blood disorders you have. Any surgeries you have had. Any medical conditions you have. Whether you are pregnant or may be pregnant. What are the risks? Generally, this is a safe procedure. However, problems may occur, including: Harm to a pregnant woman and her unborn baby. This test involves the use of radiation. Radiation exposure can be dangerous to a pregnant woman and her unborn baby. If you are pregnant, you generally should not have this procedure done. Slight increase in the risk of cancer. This is because of the radiation involved in the test. What happens before the procedure? No preparation is needed for this procedure. What happens during the procedure? You will undress and remove any jewelry around your neck or chest. You will put on a hospital gown. Sticky electrodes will be placed on your chest. The electrodes will be connected to an electrocardiogram (ECG) machine to record a tracing of the electrical activity of your heart. A CT scanner will take pictures of your heart. During this time, you will be asked to lie still and hold your breath for 2-3 seconds while a picture of your heart is being taken. The procedure may vary among health care providers and hospitals. What happens after the procedure? You can get dressed. You can return to your normal activities. It is up to you to get the results of  your test. Ask your health care provider, or the department that is doing the test, when your results will be ready. Summary A coronary calcium scan is an imaging test used to look for deposits of calcium and other fatty materials (plaques) in the inner lining of the blood vessels of the heart (coronary  arteries). Generally, this is a safe procedure. Tell your health care provider if you are pregnant or may be pregnant. No preparation is needed for this procedure. A CT scanner will take pictures of your heart. You can return to your normal activities after the scan is done. This information is not intended to replace advice given to you by your health care provider. Make sure you discuss any questions you have with your health care provider. Document Released: 03/24/2008 Document Revised: 08/15/2016 Document Reviewed: 08/15/2016 Elsevier Interactive Patient Education  2017 [place]: At [facility], you and your health needs are our priority.  As part of our continuing mission to provide you with exceptional heart care, we have created designated Provider Care Teams.  These Care Teams include your primary Cardiologist (physician) and Advanced Practice Providers (APPs -  Physician Assistants and Nurse Practitioners) who all work together to provide you with the care you need, when you need it.  We recommend signing up for the patient portal called "MyChart".  Sign up information is provided on this After Visit Summary.  MyChart is used to connect with patients for Virtual Visits (Telemedicine).  Patients are able to view lab/test results, encounter notes, upcoming appointments, etc.  Non-urgent messages can be sent to your provider as well.   To learn more about what you can do with MyChart, go to [email or web address].    Your next appointment:   We will see you on an as needed basis.  Provider:   Quay Burow, MD

## 2022-08-09 NOTE — Assessment & Plan Note (Signed)
Amanda Walters was referred by Dr. Madilyn Fireman for fatigue.  She has no cardiac risk factors other than family history with a mother that died of a heart attack at age 67.  She is very active and just split a quart of wood last week by herself.  She denies chest pain or shortness of breath.  I am going to get a 2D echo and a coronary calcium score to further evaluate.

## 2022-08-10 DIAGNOSIS — L821 Other seborrheic keratosis: Secondary | ICD-10-CM | POA: Diagnosis not present

## 2022-08-10 DIAGNOSIS — L570 Actinic keratosis: Secondary | ICD-10-CM | POA: Diagnosis not present

## 2022-08-10 DIAGNOSIS — Z85828 Personal history of other malignant neoplasm of skin: Secondary | ICD-10-CM | POA: Diagnosis not present

## 2022-08-11 NOTE — Telephone Encounter (Signed)
Patient scheduled.

## 2022-08-12 ENCOUNTER — Ambulatory Visit (INDEPENDENT_AMBULATORY_CARE_PROVIDER_SITE_OTHER): Payer: PPO | Admitting: Family Medicine

## 2022-08-12 ENCOUNTER — Encounter: Payer: Self-pay | Admitting: Family Medicine

## 2022-08-12 VITALS — BP 132/69 | HR 87 | Ht 64.5 in | Wt 196.0 lb

## 2022-08-12 DIAGNOSIS — H9201 Otalgia, right ear: Secondary | ICD-10-CM | POA: Insufficient documentation

## 2022-08-12 MED ORDER — AZITHROMYCIN 250 MG PO TABS: ORAL_TABLET | ORAL | 0 refills | Status: AC

## 2022-08-12 MED ORDER — PREDNISONE 20 MG PO TABS: 20.0000 mg | ORAL_TABLET | Freq: Two times a day (BID) | ORAL | 0 refills | Status: AC

## 2022-08-12 NOTE — Progress Notes (Signed)
Acute Office Visit  Subjective:     Patient ID: Amanda Walters, female    DOB: 1956-06-20, 66 y.o.   MRN: [id number]  Chief Complaint  Patient presents with   Ear Pain    HPI Patient is in today for R ear otalgia. She was recently seen one month ago and give steroids.   Review of Systems  Constitutional:  Negative for chills and fever.  HENT:  Positive for ear pain.   Respiratory:  Negative for cough and shortness of breath.   Cardiovascular:  Negative for chest pain.  Neurological:  Negative for headaches.      Objective:    BP 132/69   Pulse 87   Ht 5' 4.5" (1.638 m)   Wt 196 lb (88.9 kg)   SpO2 100%   BMI 33.12 kg/m    Physical Exam Vitals and nursing note reviewed.  Constitutional:      General: She is not in acute distress.    Appearance: Normal appearance.  HENT:     Head: Normocephalic and atraumatic.     Comments: No tenderness to palpation of maxillary and frontal sinuses bilaterally    Right Ear: Tympanic membrane, ear canal and external ear normal.     Left Ear: Tympanic membrane, ear canal and external ear normal.     Nose: Nose normal.  Eyes:     Conjunctiva/sclera: Conjunctivae normal.  Cardiovascular:     Rate and Rhythm: Normal rate and regular rhythm.  Pulmonary:     Effort: Pulmonary effort is normal.     Breath sounds: Normal breath sounds.  Neurological:     General: No focal deficit present.     Mental Status: She is alert and oriented to person, place, and time.  Psychiatric:        Mood and Affect: Mood normal.        Behavior: Behavior normal.        Thought Content: Thought content normal.        Judgment: Judgment normal.     No results found for any visits on 08/12/22.      Assessment & Plan:   Problem List Items Addressed This Visit       Other   Otalgia of right ear - Primary    - pt said otalgia is back but the medication given by Dr. Madilyn Fireman worked for her. Have sent in another round of prednisone. Did  discuss importance of taking allergy medication and recommended zyrtec as it might be the loratidine is not working  - also discussed nasal spray usage and pt was not using cross treatment method. Educated about cross treatment and she is willing to help  - Pt adamant that she needs an antibiotic to help with pain. Discussed likely this is allergies. With the weekend coming up I did give a prescription for a zpack to take if symptoms worsen over the weekend or do not improve with the steroids, zyrtec, or flonase.        Meds ordered this encounter  Medications   predniSONE (DELTASONE) 20 MG tablet    Sig: Take 1 tablet (20 mg total) by mouth 2 (two) times daily with a meal for 5 days.    Dispense:  10 tablet    Refill:  0   azithromycin (ZITHROMAX) 250 MG tablet    Sig: Take 2 tablets on day 1, then 1 tablet daily on days 2 through 5    Dispense:  6 tablet  Refill:  0    Return if symptoms worsen or fail to improve.  Owens Loffler, DO

## 2022-08-12 NOTE — Patient Instructions (Signed)
Use nasal spray using the cross treatment method (point tip towards your ear/eye)   Use cetirizine daily for a week   Touch base with [place] in a week to let us know how you are feeling. If no better, will send you to ENT

## 2022-08-12 NOTE — Assessment & Plan Note (Addendum)
-   pt said otalgia is back but the medication given by Dr. Madilyn Fireman worked for her. Have sent in another round of prednisone. Did discuss importance of taking allergy medication and recommended zyrtec as it might be the loratidine is not working  - also discussed nasal spray usage and pt was not using cross treatment method. Educated about cross treatment and she is willing to help  - Pt adamant that she needs an antibiotic to help with pain. Discussed likely this is allergies. With the weekend coming up I did give a prescription for a zpack to take if symptoms worsen over the weekend or do not improve with the steroids, zyrtec, or flonase.

## 2022-08-18 DIAGNOSIS — R5383 Other fatigue: Secondary | ICD-10-CM | POA: Diagnosis not present

## 2022-08-18 DIAGNOSIS — Z8249 Family history of ischemic heart disease and other diseases of the circulatory system: Secondary | ICD-10-CM | POA: Diagnosis not present

## 2022-08-18 LAB — LIPID PANEL
Chol/HDL Ratio: 4.1 ratio (ref 0.0–4.4)
Cholesterol, Total: 196 mg/dL (ref 100–199)
HDL: 48 mg/dL (ref >39)
LDL Chol Calc (NIH): 110 mg/dL — ABNORMAL HIGH (ref 0–99)
Triglycerides: 217 mg/dL — ABNORMAL HIGH (ref 0–149)
VLDL Cholesterol Cal: 38 mg/dL (ref 5–40)

## 2022-08-18 LAB — HEPATIC FUNCTION PANEL
ALT: 17 IU/L (ref 0–32)
AST: 17 IU/L (ref 0–40)
Albumin: 4.4 g/dL (ref 3.9–4.9)
Alkaline Phosphatase: 71 IU/L (ref 44–121)
Bilirubin Total: 0.5 mg/dL (ref 0.0–1.2)
Bilirubin, Direct: 0.13 mg/dL (ref 0.00–0.40)
Total Protein: 6.9 g/dL (ref 6.0–8.5)

## 2022-08-26 DIAGNOSIS — S61411A Laceration without foreign body of right hand, initial encounter: Secondary | ICD-10-CM | POA: Diagnosis not present

## 2022-08-26 DIAGNOSIS — W269XXA Contact with unspecified sharp object(s), initial encounter: Secondary | ICD-10-CM | POA: Diagnosis not present

## 2022-09-05 DIAGNOSIS — Z4802 Encounter for removal of sutures: Secondary | ICD-10-CM | POA: Diagnosis not present

## 2022-09-06 DIAGNOSIS — L03113 Cellulitis of right upper limb: Secondary | ICD-10-CM | POA: Diagnosis not present

## 2022-09-14 DIAGNOSIS — Z01411 Encounter for gynecological examination (general) (routine) with abnormal findings: Secondary | ICD-10-CM | POA: Diagnosis not present

## 2022-09-14 DIAGNOSIS — Z1231 Encounter for screening mammogram for malignant neoplasm of breast: Secondary | ICD-10-CM | POA: Diagnosis not present

## 2022-09-14 DIAGNOSIS — Z124 Encounter for screening for malignant neoplasm of cervix: Secondary | ICD-10-CM | POA: Diagnosis not present

## 2022-09-14 DIAGNOSIS — Z6834 Body mass index (BMI) 34.0-34.9, adult: Secondary | ICD-10-CM | POA: Diagnosis not present

## 2022-09-14 DIAGNOSIS — Z01419 Encounter for gynecological examination (general) (routine) without abnormal findings: Secondary | ICD-10-CM | POA: Diagnosis not present

## 2022-09-14 LAB — HM MAMMOGRAPHY

## 2022-09-22 ENCOUNTER — Encounter: Payer: Self-pay | Admitting: Family Medicine

## 2022-09-22 ENCOUNTER — Ambulatory Visit (INDEPENDENT_AMBULATORY_CARE_PROVIDER_SITE_OTHER): Payer: PPO

## 2022-09-22 ENCOUNTER — Ambulatory Visit (INDEPENDENT_AMBULATORY_CARE_PROVIDER_SITE_OTHER): Payer: PPO | Admitting: Family Medicine

## 2022-09-22 VITALS — BP 135/63 | HR 65 | Temp 98.0°F | Ht 65.5 in | Wt 196.1 lb

## 2022-09-22 DIAGNOSIS — L03011 Cellulitis of right finger: Secondary | ICD-10-CM | POA: Diagnosis not present

## 2022-09-22 DIAGNOSIS — M7989 Other specified soft tissue disorders: Secondary | ICD-10-CM | POA: Diagnosis not present

## 2022-09-22 MED ORDER — AMOXICILLIN-POT CLAVULANATE 875-125 MG PO TABS: 1.0000 | ORAL_TABLET | Freq: Two times a day (BID) | ORAL | 0 refills | Status: DC

## 2022-09-22 NOTE — Progress Notes (Signed)
Acute Office Visit  Subjective:     Patient ID: Amanda Walters, female    DOB: [birth year], 66 y.o.   MRN: [id number]  Chief Complaint  Patient presents with   Puncture Wound    Patient c/o swelling of R hand - puncture wound R hand x 08/26/22 . Stitches removed 09/05/22. Initially clindaycine - then has sweelling ans was started on doxycycline - completed this a few days ago. Still having swelling and tenderness R hand.     HPI Patient is in today for acute visit  Pt reports she was doing her project and she accidentally punctured her right hand with a screwdriver. This occurred on 11/17 and she went to urgent care after that. The area was numbed and cleansed. 2 stitches was placed and given Clindamycin. She had stitches removed 10 days later on 11/27. Pt says she still has swelling and erythema to her right 1st MCP joint on both palmar and dorsum surface. She just completed the Doxycyline for 10 days. No better. Pain 1/10. Pt UTD with Tdap.   Past Medical History:  Diagnosis Date   COVID-19 06/05/2020   Fever due to COVID-19 06/05/2020   GERD (gastroesophageal reflux disease)     Review of Systems  Constitutional:  Negative for chills, fever and weight loss.  HENT:  Negative for ear discharge and ear pain.   Respiratory:  Negative for cough and hemoptysis.   Gastrointestinal:  Negative for heartburn, nausea and vomiting.  Musculoskeletal:  Negative for myalgias and neck pain.  Skin:        Swelling and redness to right 1st finger  Neurological:  Negative for dizziness and headaches.  Psychiatric/Behavioral:  Negative for depression and suicidal ideas.   All other systems reviewed and are negative.       Objective:    There were no vitals taken for this visit.   Physical Exam Vitals and nursing note reviewed.  Constitutional:      Appearance: Normal appearance. She is normal weight.  HENT:     Head: Normocephalic and atraumatic.     Right Ear: External ear  normal.     Left Ear: External ear normal.     Nose: Nose normal.     Mouth/Throat:     Mouth: Mucous membranes are moist.     Pharynx: Oropharynx is clear.  Eyes:     Extraocular Movements: Extraocular movements intact.     Conjunctiva/sclera: Conjunctivae normal.  Cardiovascular:     Rate and Rhythm: Normal rate.  Pulmonary:     Effort: Pulmonary effort is normal.  Musculoskeletal:        General: Swelling and tenderness present.     Comments: Edema and erythema to right 1st-2nd MCP joints. TTP to area. Diminished grip squeeze due to swelling  Skin:    General: Skin is warm.     Capillary Refill: Capillary refill takes less than 2 seconds.  Neurological:     General: No focal deficit present.     Mental Status: She is alert and oriented to person, place, and time. Mental status is at baseline.  Psychiatric:        Mood and Affect: Mood normal.        Behavior: Behavior normal.        Thought Content: Thought content normal.        Judgment: Judgment normal.    No results found for any visits on 09/22/22.      Assessment & Plan:  Problem List Items Addressed This Visit   None Visit Diagnoses     Cellulitis of finger of right hand    -  Primary   Relevant Medications   amoxicillin-clavulanate (AUGMENTIN) 875-125 MG tablet   Other Relevant Orders   DG Hand Complete Right       Meds ordered this encounter  Medications   amoxicillin-clavulanate (AUGMENTIN) 875-125 MG tablet    Sig: Take 1 tablet by mouth 2 (two) times daily.    Dispense:  20 tablet    Refill:  0  Pt treated with 2 courses of antibiotics since 11/17 for puncture wound with drill bit. Allergic to sulfa so given Clindamycin and Doxycycline. No better. Pt reports not soaking hand. Advised to soak with warm water and epsom salt 20 minutes 3x a day. Added Augmentin. Had 'funny feeling' with Cephalexin. In this case benefits outweigh risk and will try Augmentin. Also will send for xray to rule out abscess  formation which would warrant hand surgeon referral. She will go now for xray and pending results, will refer.    No follow-ups on file.  Leeanne Rio, MD

## 2022-09-26 ENCOUNTER — Ambulatory Visit (INDEPENDENT_AMBULATORY_CARE_PROVIDER_SITE_OTHER): Payer: PPO

## 2022-09-26 ENCOUNTER — Encounter (HOSPITAL_BASED_OUTPATIENT_CLINIC_OR_DEPARTMENT_OTHER): Payer: Self-pay

## 2022-09-26 ENCOUNTER — Ambulatory Visit (HOSPITAL_BASED_OUTPATIENT_CLINIC_OR_DEPARTMENT_OTHER)
Admission: RE | Admit: 2022-09-26 | Discharge: 2022-09-26 | Disposition: A | Discharge status: Home / Self Care | Payer: PPO | Source: Ambulatory Visit | Attending: Cardiovascular Disease | Admitting: Cardiovascular Disease

## 2022-09-26 DIAGNOSIS — Z8249 Family history of ischemic heart disease and other diseases of the circulatory system: Secondary | ICD-10-CM

## 2022-09-26 DIAGNOSIS — R5383 Other fatigue: Secondary | ICD-10-CM

## 2022-09-26 MED ORDER — PERFLUTREN LIPID MICROSPHERE
1.0000 mL | INTRAVENOUS | Status: AC | PRN
  Administered 2022-09-26: 3 mL via INTRAVENOUS

## 2022-09-27 LAB — ECHOCARDIOGRAM COMPLETE
AR max vel: 2.17 cm2
AV Area VTI: 2.28 cm2
AV Area mean vel: 2.31 cm2
AV Mean grad: 5 mmHg
AV Peak grad: 8.5 mmHg
Ao pk vel: 1.46 m/s
Area-P 1/2: 3.06 cm2
Calc EF: 70.7 %
S' Lateral: 2.49 cm
Single Plane A2C EF: 67.4 %
Single Plane A4C EF: 73.1 %

## 2022-09-28 ENCOUNTER — Telehealth: Payer: Self-pay

## 2022-09-28 DIAGNOSIS — S46911A Strain of unspecified muscle, fascia and tendon at shoulder and upper arm level, right arm, initial encounter: Secondary | ICD-10-CM | POA: Diagnosis not present

## 2022-09-28 DIAGNOSIS — M25511 Pain in right shoulder: Secondary | ICD-10-CM | POA: Diagnosis not present

## 2022-09-28 DIAGNOSIS — L03011 Cellulitis of right finger: Secondary | ICD-10-CM

## 2022-09-28 NOTE — Telephone Encounter (Signed)
Patient called stating she continues to have cellulitis symptoms on her hand. Her knuckles are still swollen. Patient has been taking amoxicillin-clavulanate for seven days. Per provider, recommends patient sees an Orthopedic provider for additional care. Patient agreeable with recommendation. Patient preference of provider/location updated on referral request. Referral pended for provider.

## 2022-09-28 NOTE — Telephone Encounter (Signed)
Noted and advised on Orthopedic referral due to continued cellulitis symptoms since hand injury last month. Referral signed.

## 2022-10-10 HISTORY — PX: HAND SURGERY: SHX662

## 2022-10-11 DIAGNOSIS — S61431A Puncture wound without foreign body of right hand, initial encounter: Secondary | ICD-10-CM | POA: Insufficient documentation

## 2022-10-11 DIAGNOSIS — M79641 Pain in right hand: Secondary | ICD-10-CM | POA: Diagnosis not present

## 2022-10-14 DIAGNOSIS — S60551A Superficial foreign body of right hand, initial encounter: Secondary | ICD-10-CM | POA: Diagnosis not present

## 2022-10-17 DIAGNOSIS — M79641 Pain in right hand: Secondary | ICD-10-CM | POA: Diagnosis not present

## 2022-10-17 DIAGNOSIS — S60551A Superficial foreign body of right hand, initial encounter: Secondary | ICD-10-CM | POA: Diagnosis not present

## 2022-10-17 DIAGNOSIS — R2231 Localized swelling, mass and lump, right upper limb: Secondary | ICD-10-CM | POA: Diagnosis not present

## 2022-10-20 DIAGNOSIS — S61431D Puncture wound without foreign body of right hand, subsequent encounter: Secondary | ICD-10-CM | POA: Diagnosis not present

## 2023-01-28 DIAGNOSIS — M25511 Pain in right shoulder: Secondary | ICD-10-CM | POA: Diagnosis not present

## 2023-02-28 DIAGNOSIS — S61431D Puncture wound without foreign body of right hand, subsequent encounter: Secondary | ICD-10-CM | POA: Diagnosis not present

## 2023-03-01 DIAGNOSIS — H524 Presbyopia: Secondary | ICD-10-CM | POA: Diagnosis not present

## 2023-03-01 DIAGNOSIS — H52223 Regular astigmatism, bilateral: Secondary | ICD-10-CM | POA: Diagnosis not present

## 2023-03-01 DIAGNOSIS — H43813 Vitreous degeneration, bilateral: Secondary | ICD-10-CM | POA: Diagnosis not present

## 2023-03-01 DIAGNOSIS — D3131 Benign neoplasm of right choroid: Secondary | ICD-10-CM | POA: Diagnosis not present

## 2023-03-01 DIAGNOSIS — H5213 Myopia, bilateral: Secondary | ICD-10-CM | POA: Diagnosis not present

## 2023-03-01 DIAGNOSIS — H2512 Age-related nuclear cataract, left eye: Secondary | ICD-10-CM | POA: Diagnosis not present

## 2023-03-01 DIAGNOSIS — H25811 Combined forms of age-related cataract, right eye: Secondary | ICD-10-CM | POA: Diagnosis not present

## 2023-03-01 DIAGNOSIS — H35443 Age-related reticular degeneration of retina, bilateral: Secondary | ICD-10-CM | POA: Diagnosis not present

## 2023-03-01 DIAGNOSIS — D3132 Benign neoplasm of left choroid: Secondary | ICD-10-CM | POA: Diagnosis not present

## 2023-03-20 ENCOUNTER — Ambulatory Visit (INDEPENDENT_AMBULATORY_CARE_PROVIDER_SITE_OTHER): Payer: PPO | Admitting: Medical-Surgical

## 2023-03-20 VITALS — BP 125/67 | HR 76 | Wt 182.0 lb

## 2023-03-20 DIAGNOSIS — Z0000 Encounter for general adult medical examination without abnormal findings: Secondary | ICD-10-CM

## 2023-03-20 NOTE — Progress Notes (Signed)
MEDICARE ANNUAL WELLNESS VISIT  03/20/2023  Telephone Visit Disclaimer This Medicare AWV was conducted by telephone due to national recommendations for restrictions regarding the COVID-19 Pandemic (e.g. social distancing).  I verified, using two identifiers, that I am speaking with Amanda Walters or their authorized healthcare agent. I discussed the limitations, risks, security, and privacy concerns of performing an evaluation and management service by telephone and the potential availability of an in-person appointment in the future. The patient expressed understanding and agreed to proceed.  Location of Patient: Home Location of Provider (nurse):  Provider home  Subjective:    Amanda Walters is a 67 y.o. female patient of Christen Butter, NP who had a Medicare Annual Wellness Visit today via telephone. Amanda Walters is Retired and lives alone. she does not have any children. she reports that she is socially active and does interact with friends/family regularly. she is moderately physically active and enjoys yardwork, croteching, baking and reading.  Patient Care Team: Christen Butter, NP as PCP - General (Nurse Practitioner)     03/20/2023    2:06 PM 03/15/2022    1:10 PM 03/10/2021    1:22 PM  Advanced Directives  Does Patient Have a Medical Advance Directive? No No No  Would patient like information on creating a medical advance directive? No - Patient declined No - Patient declined No - Patient declined    Hospital Utilization Over the Past 12 Months: # of hospitalizations or ER visits: 0 # of surgeries: 1  Review of Systems    Patient reports that her overall health is better compared to last year.  History obtained from chart review and the patient  Patient Reported Readings (BP, Pulse, CBG, Weight, etc) BP: 125/67  Weight: 182 lb Pulse: 76  Pain Assessment Pain : No/denies pain     Current Medications & Allergies (verified) Allergies as of 03/20/2023       Reactions    Misc. Sulfonamide Containing Compounds Hives   Morphine And Codeine Itching   Sulfa Antibiotics Hives   Morphine Hives   Cephalexin Other (See Comments)   "funny feeling"        Medication List        Accurate as of March 20, 2023  2:18 PM. If you have any questions, ask your nurse or doctor.          STOP taking these medications    amoxicillin-clavulanate 875-125 MG tablet Commonly known as: AUGMENTIN       TAKE these medications    ascorbic acid 500 MG tablet Commonly known as: VITAMIN C Vitamin C 500 mg tablet   2 tablets every day by oral route.   Biotin 1000 MCG tablet Take 1,000 mcg by mouth daily.   CITRACAL PO Take 2 tablets by mouth daily.   cyanocobalamin 1000 MCG tablet Commonly known as: VITAMIN B12 Take 1,500 mcg by mouth daily.   estradiol 1 MG tablet Commonly known as: ESTRACE 1.5 mg daily in the afternoon.   fluorouracil 5 % cream Commonly known as: EFUDEX SMARTSIG:1 sparingly Topical As Directed   gabapentin 100 MG capsule Commonly known as: NEURONTIN Take 1 capsule (100 mg total) by mouth 3 (three) times daily.   Loratadine 10 MG Caps Take by mouth every morning.   MAGNESIUM PO Take 250 mg by mouth daily.   methocarbamol 500 MG tablet Commonly known as: ROBAXIN Take 1 tablet by mouth every 8 (eight) hours.   MULTIVITAMIN PO Take 2 tablets by mouth daily.  One a day pedit   omeprazole 20 MG capsule Commonly known as: PRILOSEC Take 20 mg by mouth daily.   Vitamin D3 125 MCG (5000 UT) Caps Take 5,000 Units by mouth daily.   ZINC PO Take 50 mg by mouth daily.        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   TOTAL KNEE ARTHROPLASTY  2011   Family History  Problem Relation Age of Onset   Heart attack Other        mother    Colon cancer Neg Hx    Esophageal cancer Neg Hx    Social History   Socioeconomic History   Marital status: Single    Spouse name: Not on file   Number of children: 0   Years of education: 6   Highest education level: Master's degree (e.g., MA, MS, MEng, MEd, MSW, MBA)  Occupational History   Occupation: Nurse, learning disability    Comment: Retired  Tobacco Use   Smoking status: Never   Smokeless tobacco: Never  Vaping Use   Vaping Use: Never used  Substance and Sexual Activity   Alcohol use: Not Currently   Drug use: Never   Sexual activity: Not Currently  Other Topics Concern   Not on file  Social History Narrative   Lives alone with her dog. She enjoys yardwork, croteching, baking and reading.    Social Determinants of Health   Financial Resource Strain: Low Risk  (03/20/2023)   Overall Financial Resource Strain (CARDIA)    Difficulty of Paying Living Expenses: Not hard at all  Food Insecurity: No Food Insecurity (03/20/2023)   Hunger Vital Sign    Worried About Running Out of Food in the Last Year: Never true    Ran Out of Food in the Last Year: Never true  Transportation Needs: No Transportation Needs (03/20/2023)   PRAPARE - Administrator, Civil Service (Medical): No    Lack of Transportation (Non-Medical): No  Physical Activity: Inactive (03/20/2023)   Exercise Vital Sign    Days of Exercise per Week: 0 days    Minutes of Exercise per Session: 0 min  Stress: No Stress Concern Present (03/20/2023)   [employer] of Occupational Health - Occupational Stress Questionnaire    Feeling of Stress : Not at all  Social  Connections: Moderately Integrated (03/20/2023)   Social Connection and Isolation Panel [NHANES]    Frequency of Communication with Friends and Family: More than three times a week    Frequency of Social Gatherings with Friends and Family: More than three times a week    Attends Religious Services: 1 to 4 times per year    Active Member of [employer] or Organizations: No    Attends Banker Meetings: Never    Marital Status: Married    Activities of Daily Living    03/20/2023  2:11 PM  In your present state of health, do you have any difficulty performing the following activities:  Hearing? 0  Vision? 0  Difficulty concentrating or making decisions? 0  Walking or climbing stairs? 0  Dressing or bathing? 0  Doing errands, shopping? 0  Preparing Food and eating ? N  Using the Toilet? N  In the past six months, have you accidently leaked urine? N  Do you have problems with loss of bowel control? N  Managing your Medications? N  Managing your Finances? N  Housekeeping or managing your Housekeeping? N    Patient Education/ Literacy How often do you need to have someone help you when you read instructions, pamphlets, or other written materials from your doctor or pharmacy?: 1 - Never What is the last grade level you completed in school?: two masters degrees  Exercise Current Exercise Habits: The patient has a physically strenuous job, but has no regular exercise apart from work., Exercise limited by: None identified  Diet Patient reports consuming 2 meals a day and 1 snack(s) a day Patient reports that her primary diet is: Regular Patient reports that she does have regular access to food.   Depression Screen    03/20/2023    2:07 PM 08/12/2022    8:41 AM 07/18/2022    1:40 PM 03/15/2022    1:10 PM 02/23/2022    8:42 AM 03/10/2021    1:28 PM 11/25/2019    9:42 AM  PHQ 2/9 Scores  PHQ - 2 Score 0 0 0 0 0 0 0  PHQ- 9 Score       0     Fall Risk    03/20/2023    2:06 PM  08/12/2022    8:40 AM 07/18/2022    1:39 PM 03/15/2022    1:14 PM 02/23/2022    8:42 AM  Fall Risk   Falls in the past year? 0 0 0 0 0  Number falls in past yr: 0 0 0 0 0  Injury with Fall? 0 0 0 0 0  Risk for fall due to : No Fall Risks No Fall Risks No Fall Risks No Fall Risks No Fall Risks  Follow up Falls evaluation completed Falls evaluation completed Falls evaluation completed Falls evaluation completed Falls evaluation completed     Objective:  Amanda Walters seemed alert and oriented and she participated appropriately during our telephone visit.  Blood Pressure Weight BMI  BP Readings from Last 3 Encounters:  03/20/23 125/67  09/22/22 135/63  08/12/22 132/69   Wt Readings from Last 3 Encounters:  03/20/23 182 lb (82.6 kg)  09/22/22 196 lb 2 oz (89 kg)  08/12/22 196 lb (88.9 kg)   BMI Readings from Last 1 Encounters:  03/20/23 29.83 kg/m    *Unable to obtain current vital signs, weight, and BMI due to telephone visit type  Hearing/Vision  Amanda Walters did not seem to have difficulty with hearing/understanding during the telephone conversation Reports that she has had a formal eye exam by an eye care professional within the past year Reports that she has not had a formal hearing evaluation within the past year *Unable to fully assess hearing and vision during telephone visit type  Cognitive Function:    03/20/2023    2:12 PM 03/15/2022    1:17 PM 03/10/2021    1:34 PM  6CIT Screen  What Year? 0 points 0 points 0 points  What month? 0 points 0 points 0 points  What time? 0  points 0 points 0 points  Count back from 20 0 points 0 points 0 points  Months in reverse 0 points 0 points 0 points  Repeat phrase 0 points 0 points 0 points  Total Score 0 points 0 points 0 points   (Normal:0-7, Significant for Dysfunction: >8)  Normal Cognitive Function Screening: Yes   Immunization & Health Maintenance Record Immunization History  Administered Date(s) Administered   Fluad  Quad(high Dose 65+) 09/14/2020   Influenza,inj,Quad PF,6+ Mos 07/11/2017, 07/16/2018, 07/24/2019   Influenza-Unspecified 07/10/2014   PNEUMOCOCCAL CONJUGATE-20 03/10/2021   PPD Test 11/25/2019   Tdap 06/11/2016, 02/16/2019   Zoster Recombinat (Shingrix) 07/11/2017, 09/14/2017    Health Maintenance  Topic Date Due   Medicare Annual Wellness (AWV)  03/16/2023   DEXA SCAN  03/19/2024 (Originally 03/03/2020)   INFLUENZA VACCINE  05/11/2023   MAMMOGRAM  08/24/2023   Colonoscopy  07/15/2028   DTaP/Tdap/Td (3 - Td or Tdap) 02/15/2029   Pneumonia Vaccine 29+ Years old  Completed   Hepatitis C Screening  Completed   Zoster Vaccines- Shingrix  Completed   HPV VACCINES  Aged Out   COVID-19 Vaccine  Discontinued   Fecal DNA (Cologuard)  Discontinued       Assessment  This is a routine wellness examination for Amanda Walters.  Health Maintenance: Due or Overdue Health Maintenance Due  Topic Date Due   Medicare Annual Wellness (AWV)  03/16/2023    Amanda Walters does not need a referral for Community Assistance: Care Management:   no Social Work:    no Prescription Assistance:  no Nutrition/Diabetes Education:  no   Plan:  Personalized Goals  Goals Addressed               This Visit's Progress     Patient Stated (pt-stated)        Patient stated she would like to do eat a little better.       Personalized Health Maintenance & Screening Recommendations  Screening mammography Bone densitometry screening  Patient stated that she had these completed at her GYN's office.   Lung Cancer Screening Recommended: no (Low Dose CT Chest recommended if Age 65-80 years, 20 pack-year currently smoking OR have quit w/in past 15 years) Hepatitis C Screening recommended: no HIV Screening recommended: no  Advanced Directives: Written information was not prepared per patient's request.  Referrals & Orders No orders of the defined types were placed in this  encounter.   Follow-up Plan Follow-up with Christen Butter, NP as planned Please have the GYN office fax the mammogram and the dexa scan results.  Medicare wellness visit in one year.  Patient will access AVS on my chart.   I have personally reviewed and noted the following in the patient's chart:   Medical and social history Use of alcohol, tobacco or illicit drugs  Current medications and supplements Functional ability and status Nutritional status Physical activity Advanced directives List of other physicians Hospitalizations, surgeries, and ER visits in previous 12 months Vitals Screenings to include cognitive, depression, and falls Referrals and appointments  In addition, I have reviewed and discussed with Amanda Walters certain preventive protocols, quality metrics, and best practice recommendations. A written personalized care plan for preventive services as well as general preventive health recommendations is available and can be mailed to the patient at her request.      Modesto Charon, RN BSN  03/20/2023

## 2023-03-20 NOTE — Patient Instructions (Addendum)
MEDICARE ANNUAL WELLNESS VISIT Health Maintenance Summary and Written Plan of Care  [name] ,  Thank you for allowing me to perform your Medicare Annual Wellness Visit and for your ongoing commitment to your health.   Health Maintenance & Immunization History Health Maintenance  Topic Date Due   DEXA SCAN  03/19/2024 (Originally 03/03/2020)   INFLUENZA VACCINE  05/11/2023   MAMMOGRAM  08/24/2023   Medicare Annual Wellness (AWV)  03/19/2024   Colonoscopy  07/15/2028   DTaP/Tdap/Td (3 - Td or Tdap) 02/15/2029   Pneumonia Vaccine 57+ Years old  Completed   Hepatitis C Screening  Completed   Zoster Vaccines- Shingrix  Completed   HPV VACCINES  Aged Out   COVID-19 Vaccine  Discontinued   Fecal DNA (Cologuard)  Discontinued   Immunization History  Administered Date(s) Administered   Fluad Quad(high Dose 65+) 09/14/2020   Influenza,inj,Quad PF,6+ Mos 07/11/2017, 07/16/2018, 07/24/2019   Influenza-Unspecified 07/10/2014   PNEUMOCOCCAL CONJUGATE-20 03/10/2021   PPD Test 11/25/2019   Tdap 06/11/2016, 02/16/2019   Zoster Recombinat (Shingrix) 07/11/2017, 09/14/2017    These are the patient goals that we discussed:  Goals Addressed               This Visit's Progress     Patient Stated (pt-stated)        Patient stated she would like to do eat a little better.         This is a list of Health Maintenance Items that are overdue or due now: Screening mammography Bone densitometry screening  Patient stated that she had these completed at her GYN's office.     Orders/Referrals Placed Today: No orders of the defined types were placed in this encounter.  (Contact our referral department at [phone] if you have not spoken with someone about your referral appointment within the next 5 days)    Follow-up Plan Follow-up with Christen Butter, NP as planned Please have the GYN office fax the mammogram and the dexa scan results.  Medicare wellness visit in one year.   Patient will access AVS on my chart.      Health Maintenance, Female Adopting a healthy lifestyle and getting preventive care are important in promoting health and wellness. Ask your health care provider about: The right schedule for you to have regular tests and exams. Things you can do on your own to prevent diseases and keep yourself healthy. What should I know about diet, weight, and exercise? Eat a healthy diet  Eat a diet that includes plenty of vegetables, fruits, low-fat dairy products, and lean protein. Do not eat a lot of foods that are high in solid fats, added sugars, or sodium. Maintain a healthy weight Body mass index (BMI) is used to identify weight problems. It estimates body fat based on height and weight. Your health care provider can help determine your BMI and help you achieve or maintain a healthy weight. Get regular exercise Get regular exercise. This is one of the most important things you can do for your health. Most adults should: Exercise for at least 150 minutes each week. The exercise should increase your heart rate and make you sweat (moderate-intensity exercise). Do strengthening exercises at least twice a week. This is in addition to the moderate-intensity exercise. Spend less time sitting. Even light physical activity can be beneficial. Watch cholesterol and blood lipids Have your blood tested for lipids and cholesterol at 68 years of age, then have this test every 5 years. Have your cholesterol  levels checked more often if: Your lipid or cholesterol levels are high. You are older than 68 years of age. You are at high risk for heart disease. What should I know about cancer screening? Depending on your health history and family history, you may need to have cancer screening at various ages. This may include screening for: Breast cancer. Cervical cancer. Colorectal cancer. Skin cancer. Lung cancer. What should I know about heart disease, diabetes, and  high blood pressure? Blood pressure and heart disease High blood pressure causes heart disease and increases the risk of stroke. This is more likely to develop in people who have high blood pressure readings or are overweight. Have your blood pressure checked: Every 3-5 years if you are 56-37 years of age. Every year if you are 34 years old or older. Diabetes Have regular diabetes screenings. This checks your fasting blood sugar level. Have the screening done: Once every three years after age 79 if you are at a normal weight and have a low risk for diabetes. More often and at a younger age if you are overweight or have a high risk for diabetes. What should I know about preventing infection? Hepatitis B If you have a higher risk for hepatitis B, you should be screened for this virus. Talk with your health care provider to find out if you are at risk for hepatitis B infection. Hepatitis C Testing is recommended for: Everyone born from [birth year] through 1966. Anyone with known risk factors for hepatitis C. Sexually transmitted infections (STIs) Get screened for STIs, including gonorrhea and chlamydia, if: You are sexually active and are younger than 68 years of age. You are older than 68 years of age and your health care provider tells you that you are at risk for this type of infection. Your sexual activity has changed since you were last screened, and you are at increased risk for chlamydia or gonorrhea. Ask your health care provider if you are at risk. Ask your health care provider about whether you are at high risk for HIV. Your health care provider may recommend a prescription medicine to help prevent HIV infection. If you choose to take medicine to prevent HIV, you should first get tested for HIV. You should then be tested every 3 months for as long as you are taking the medicine. Pregnancy If you are about to stop having your period (premenopausal) and you may become pregnant, seek counseling  before you get pregnant. Take 400 to 800 micrograms (mcg) of folic acid every day if you become pregnant. Ask for birth control (contraception) if you want to prevent pregnancy. Osteoporosis and menopause Osteoporosis is a disease in which the bones lose minerals and strength with aging. This can result in bone fractures. If you are 72 years old or older, or if you are at risk for osteoporosis and fractures, ask your health care provider if you should: Be screened for bone loss. Take a calcium or vitamin D supplement to lower your risk of fractures. Be given hormone replacement therapy (HRT) to treat symptoms of menopause. Follow these instructions at home: Alcohol use Do not drink alcohol if: Your health care provider tells you not to drink. You are pregnant, may be pregnant, or are planning to become pregnant. If you drink alcohol: Limit how much you have to: 0-1 drink a day. Know how much alcohol is in your drink. In the [place] one drink equals one 12 oz bottle of beer (355 mL), one 5 oz glass of  wine (148 mL), or one 1 oz glass of hard liquor (44 mL). Lifestyle Do not use any products that contain nicotine or tobacco. These products include cigarettes, chewing tobacco, and vaping devices, such as e-cigarettes. If you need help quitting, ask your health care provider. Do not use street drugs. Do not share needles. Ask your health care provider for help if you need support or information about quitting drugs. General instructions Schedule regular health, dental, and eye exams. Stay current with your vaccines. Tell your health care provider if: You often feel depressed. You have ever been abused or do not feel safe at home. Summary Adopting a healthy lifestyle and getting preventive care are important in promoting health and wellness. Follow your health care provider's instructions about healthy diet, exercising, and getting tested or screened for diseases. Follow your health care  provider's instructions on monitoring your cholesterol and blood pressure. This information is not intended to replace advice given to you by your health care provider. Make sure you discuss any questions you have with your health care provider. Document Revised: 02/15/2021 Document Reviewed: 02/15/2021 Elsevier Patient Education  2024 [employer].

## 2023-04-14 ENCOUNTER — Telehealth: Payer: Self-pay | Admitting: Medical-Surgical

## 2023-04-14 NOTE — Telephone Encounter (Signed)
Pt requesting referral for ENT since her ears are still hurting and also still having foot pain and would like to see someone for Neuropathy. Please advise pt

## 2023-04-14 NOTE — Telephone Encounter (Signed)
Since it has been quite a while since we saw her for the ear pain and discussed neuropathy, please have her schedule an appointment with me to discuss and determine further measures. ___________________________________________ Thayer Ohm, DNP, APRN, FNP-BC Primary Care and Sports Medicine [facility]

## 2023-04-17 NOTE — Telephone Encounter (Signed)
Pt scheduled for 04/24/23

## 2023-04-24 ENCOUNTER — Ambulatory Visit: Payer: PPO | Admitting: Medical-Surgical

## 2023-04-24 ENCOUNTER — Encounter: Payer: Self-pay | Admitting: Medical-Surgical

## 2023-04-24 VITALS — BP 119/64 | HR 86 | Resp 20 | Ht 65.5 in | Wt 183.0 lb

## 2023-04-24 DIAGNOSIS — G629 Polyneuropathy, unspecified: Secondary | ICD-10-CM | POA: Diagnosis not present

## 2023-04-24 DIAGNOSIS — H9201 Otalgia, right ear: Secondary | ICD-10-CM | POA: Diagnosis not present

## 2023-04-24 DIAGNOSIS — Z8719 Personal history of other diseases of the digestive system: Secondary | ICD-10-CM

## 2023-04-24 DIAGNOSIS — G5762 Lesion of plantar nerve, left lower limb: Secondary | ICD-10-CM

## 2023-04-24 DIAGNOSIS — B351 Tinea unguium: Secondary | ICD-10-CM

## 2023-04-24 MED ORDER — FLUOCINOLONE ACETONIDE 0.01 % OT OIL: 5.0000 [drp] | TOPICAL_OIL | Freq: Two times a day (BID) | OTIC | 0 refills | Status: AC | PRN

## 2023-04-24 MED ORDER — TERBINAFINE HCL 250 MG PO TABS: 250.0000 mg | ORAL_TABLET | Freq: Every day | ORAL | 0 refills | Status: DC

## 2023-04-24 NOTE — Progress Notes (Signed)
        Established patient visit  History, exam, impression, and plan:  1. Neuropathy 2. Morton's neuroma of left foot Pleasant 67 year old female presenting today for follow-up on neuropathy.  Notes that she developed bilateral numbness to the balls of her feet after having COVID.  She was referred to podiatry but felt that they did not do much for her.  Originally started on gabapentin 100 mg 3 times daily however she heard that this medication can mess with your memory and worsening memory loss.  She stopped the medication on her own and has not been taking anything for her symptoms.  Also notes that the Morton's neuroma of her left foot has been acting up lately.  Would prefer not to go back to podiatry at this point.  We discussed recommendations for treatment/management of neuropathy.  We can certainly do further evaluation with nerve conduction studies but will hold off for now.  Discussed possibly switching from gabapentin to Lyrica 50 mg twice daily.  She will look at the information on this medication and let me know if this is something she would like to pursue.  3. Onychomycosis Was treated last year for onychomycosis of the great toe using Lamisil for 12 weeks.  Notes that the infection did improve and has started to grow out however it has remained stagnant over the last several months.  Not interested in partial or complete toenail removal at this time but would like to try retreating with oral Lamisil since it helped last year.  Most recent liver function normal.  Restarting Lamisil with instructions to return for recheck of liver function in 4 weeks.  If doing well at that time, plan to continue for a full 12 weeks.  4. History of abdominal hernia Has a history of an abdominal hernia just above and to the right of the umbilical region.  Had surgical repair done at the hospital in [place] and has been doing well overall.  Unfortunately, she has recently began to have discomfort in  that same area and would like further evaluation.  Has had no redness, firmness, tenderness, or severe pain but notes that her original hernia did not have any of these symptoms either.  On evaluation, no palpable abnormality in the area and no tenderness that is reproducible.  Will like to refer to general surgery to discuss the repair that was completed and any complications that may have arisen since then. - Ambulatory referral to General Surgery  5. Otalgia of right ear Has been having intermittent right ear pain over the last year that is described as dull and achy.  No fevers, chills, ear drainage, popping, clicking, hearing loss, etc.  Uses Q-tips to clean out her ears but reports that she does not put them too far into the canal.  On evaluation, bilateral TMs normal.  Left ear canal normal.  Right ear canal with visible erythema along the bottom, no swelling, discharge, or bleeding.  Unclear etiology.  We will try fluocinolone 0.01% oil twice daily over the next 5-7 days to see if this provides any relief.  If not, consider referral to ENT for further evaluation.  Procedures performed this visit: None.  Return if symptoms worsen or fail to improve.  __________________________________ Thayer Ohm, DNP, APRN, FNP-BC Primary Care and Sports Medicine [facility] [place]

## 2023-04-27 ENCOUNTER — Encounter: Payer: Self-pay | Admitting: Medical-Surgical

## 2023-04-28 MED ORDER — GABAPENTIN 100 MG PO CAPS: 100.0000 mg | ORAL_CAPSULE | Freq: Three times a day (TID) | ORAL | 3 refills | Status: DC

## 2023-05-05 DIAGNOSIS — R1033 Periumbilical pain: Secondary | ICD-10-CM | POA: Diagnosis not present

## 2023-05-05 DIAGNOSIS — R072 Precordial pain: Secondary | ICD-10-CM | POA: Diagnosis not present

## 2023-05-05 DIAGNOSIS — M25511 Pain in right shoulder: Secondary | ICD-10-CM | POA: Diagnosis not present

## 2023-05-23 ENCOUNTER — Other Ambulatory Visit: Payer: Self-pay

## 2023-05-23 DIAGNOSIS — B351 Tinea unguium: Secondary | ICD-10-CM

## 2023-05-23 NOTE — Progress Notes (Signed)
 Switched to [employer].

## 2023-05-24 ENCOUNTER — Other Ambulatory Visit: Payer: Self-pay | Admitting: Medical-Surgical

## 2023-05-24 MED ORDER — TERBINAFINE HCL 250 MG PO TABS: 250.0000 mg | ORAL_TABLET | Freq: Every day | ORAL | 0 refills | Status: DC

## 2023-06-13 DIAGNOSIS — R590 Localized enlarged lymph nodes: Secondary | ICD-10-CM | POA: Diagnosis not present

## 2023-06-13 DIAGNOSIS — S46011A Strain of muscle(s) and tendon(s) of the rotator cuff of right shoulder, initial encounter: Secondary | ICD-10-CM | POA: Diagnosis not present

## 2023-06-13 DIAGNOSIS — M25411 Effusion, right shoulder: Secondary | ICD-10-CM | POA: Diagnosis not present

## 2023-06-13 DIAGNOSIS — M19011 Primary osteoarthritis, right shoulder: Secondary | ICD-10-CM | POA: Diagnosis not present

## 2023-06-13 DIAGNOSIS — S43431A Superior glenoid labrum lesion of right shoulder, initial encounter: Secondary | ICD-10-CM | POA: Diagnosis not present

## 2023-06-13 DIAGNOSIS — M67911 Unspecified disorder of synovium and tendon, right shoulder: Secondary | ICD-10-CM | POA: Diagnosis not present

## 2023-06-13 DIAGNOSIS — J90 Pleural effusion, not elsewhere classified: Secondary | ICD-10-CM | POA: Diagnosis not present

## 2023-06-19 DIAGNOSIS — M7551 Bursitis of right shoulder: Secondary | ICD-10-CM | POA: Diagnosis not present

## 2023-07-06 DIAGNOSIS — D1801 Hemangioma of skin and subcutaneous tissue: Secondary | ICD-10-CM | POA: Diagnosis not present

## 2023-07-06 DIAGNOSIS — L821 Other seborrheic keratosis: Secondary | ICD-10-CM | POA: Diagnosis not present

## 2023-07-06 DIAGNOSIS — L820 Inflamed seborrheic keratosis: Secondary | ICD-10-CM | POA: Diagnosis not present

## 2023-07-06 DIAGNOSIS — Z85828 Personal history of other malignant neoplasm of skin: Secondary | ICD-10-CM | POA: Diagnosis not present

## 2023-07-06 DIAGNOSIS — L570 Actinic keratosis: Secondary | ICD-10-CM | POA: Diagnosis not present

## 2023-07-06 DIAGNOSIS — L814 Other melanin hyperpigmentation: Secondary | ICD-10-CM | POA: Diagnosis not present

## 2023-07-14 DIAGNOSIS — M25511 Pain in right shoulder: Secondary | ICD-10-CM | POA: Diagnosis not present

## 2023-07-21 DIAGNOSIS — M25511 Pain in right shoulder: Secondary | ICD-10-CM | POA: Diagnosis not present

## 2023-07-30 IMAGING — DX DG FOOT COMPLETE 3+V*R*
3 series · 3 of 3 positions shown · non-contrast
Comparison: None Available.

CLINICAL DATA: Neuropathy

EXAM:
RIGHT FOOT COMPLETE - 3+ VIEW; LEFT FOOT - COMPLETE 3+ VIEW

[foot ap wb]
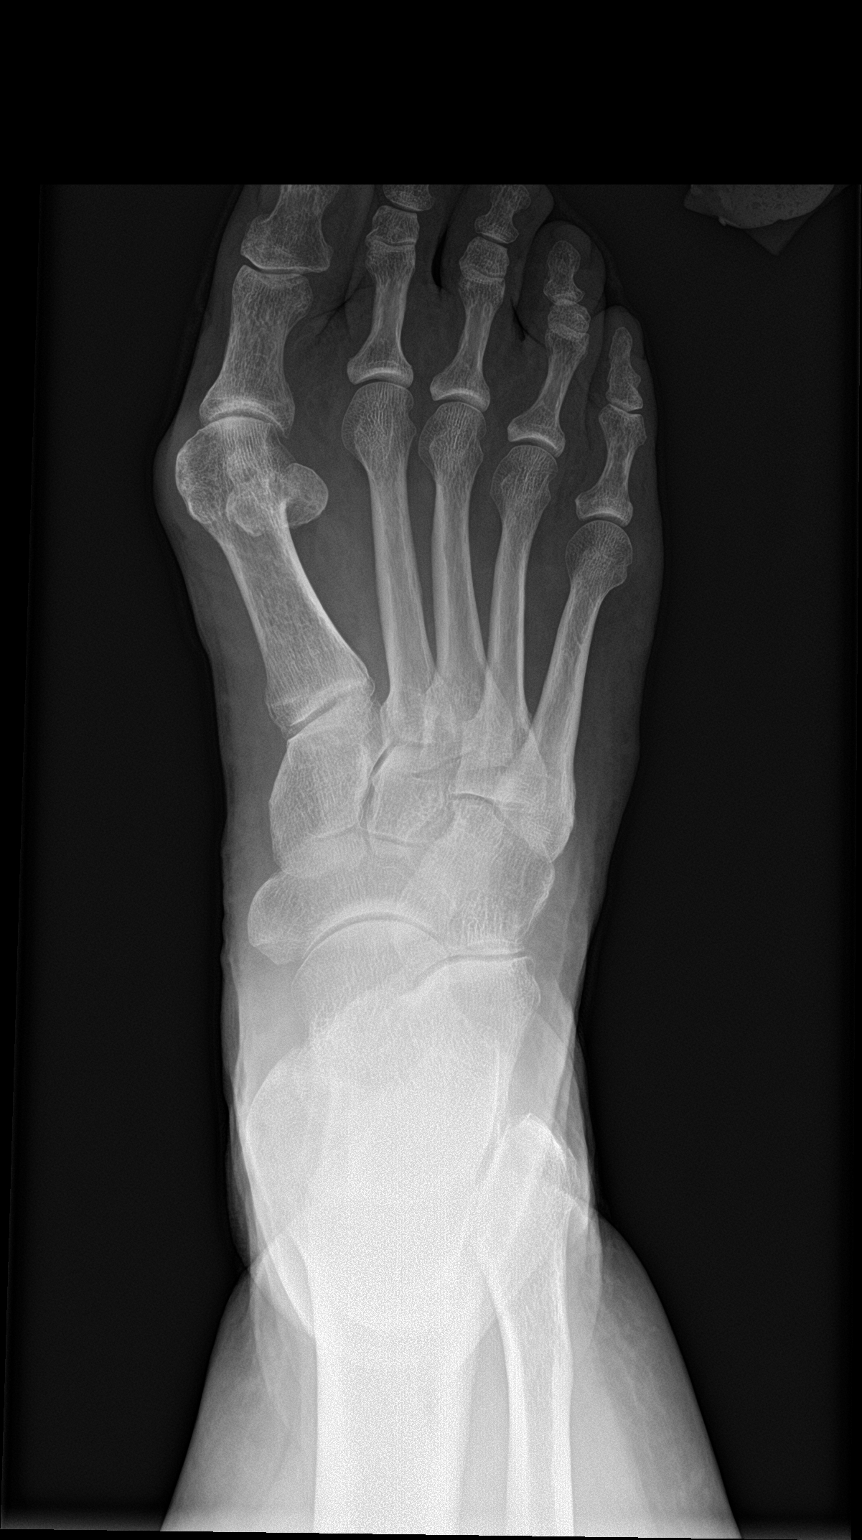

[foot obl wb]
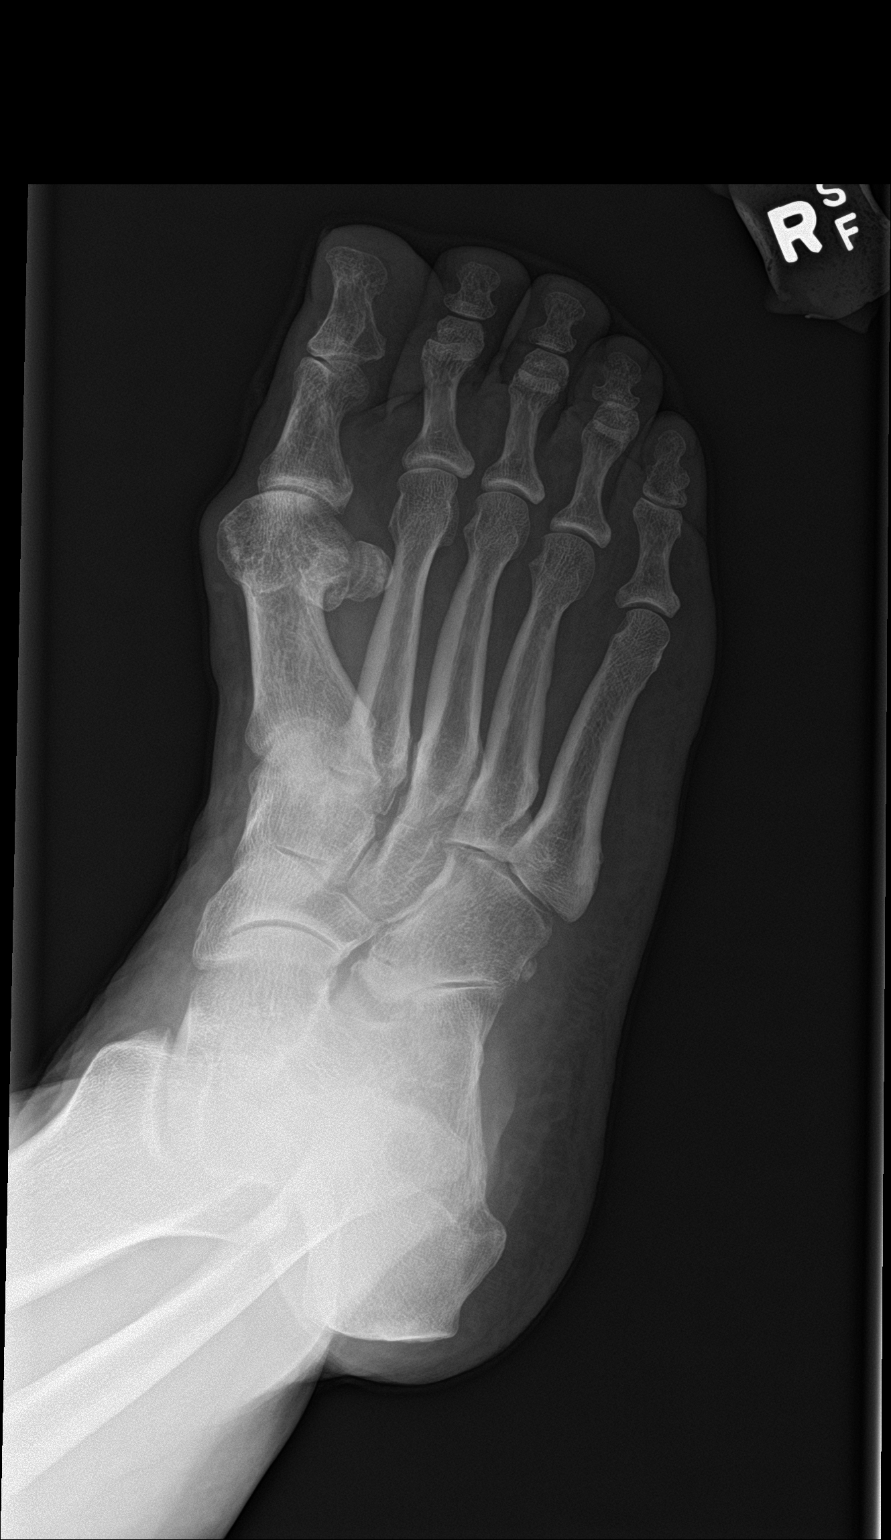

[foot lat wb]
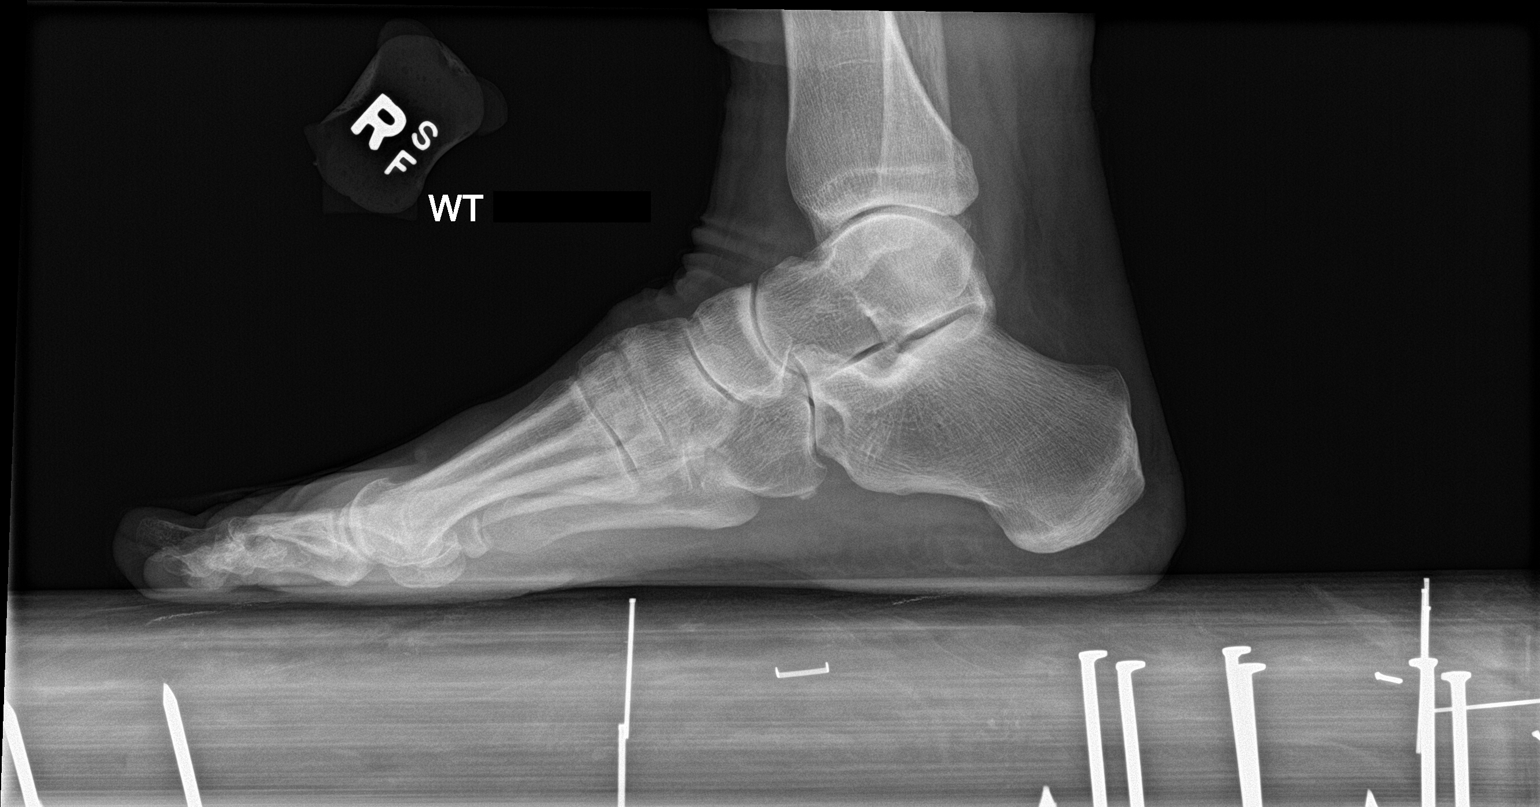

[3 of 3 positions shown; findings below may reference images not displayed]

FINDINGS: No fracture or dislocation of the bilateral feet. Bunion deformity
of the right great toe with minimal associated arthrosis. Joint
spaces are otherwise well preserved bilaterally. Normal alignment of
the midfoot on lateral weight-bearing views. Soft tissues are
unremarkable.
IMPRESSION: 1. No fracture or dislocation of the bilateral feet.
2. Bunion deformity of the right great toe with minimal associated
arthrosis. Joint spaces are otherwise well preserved bilaterally.

## 2023-07-30 IMAGING — DX DG FOOT COMPLETE 3+V*L*
3 series · 3 of 3 positions shown · non-contrast
Comparison: None Available.

CLINICAL DATA: Neuropathy

EXAM:
RIGHT FOOT COMPLETE - 3+ VIEW; LEFT FOOT - COMPLETE 3+ VIEW

[foot ap wb]
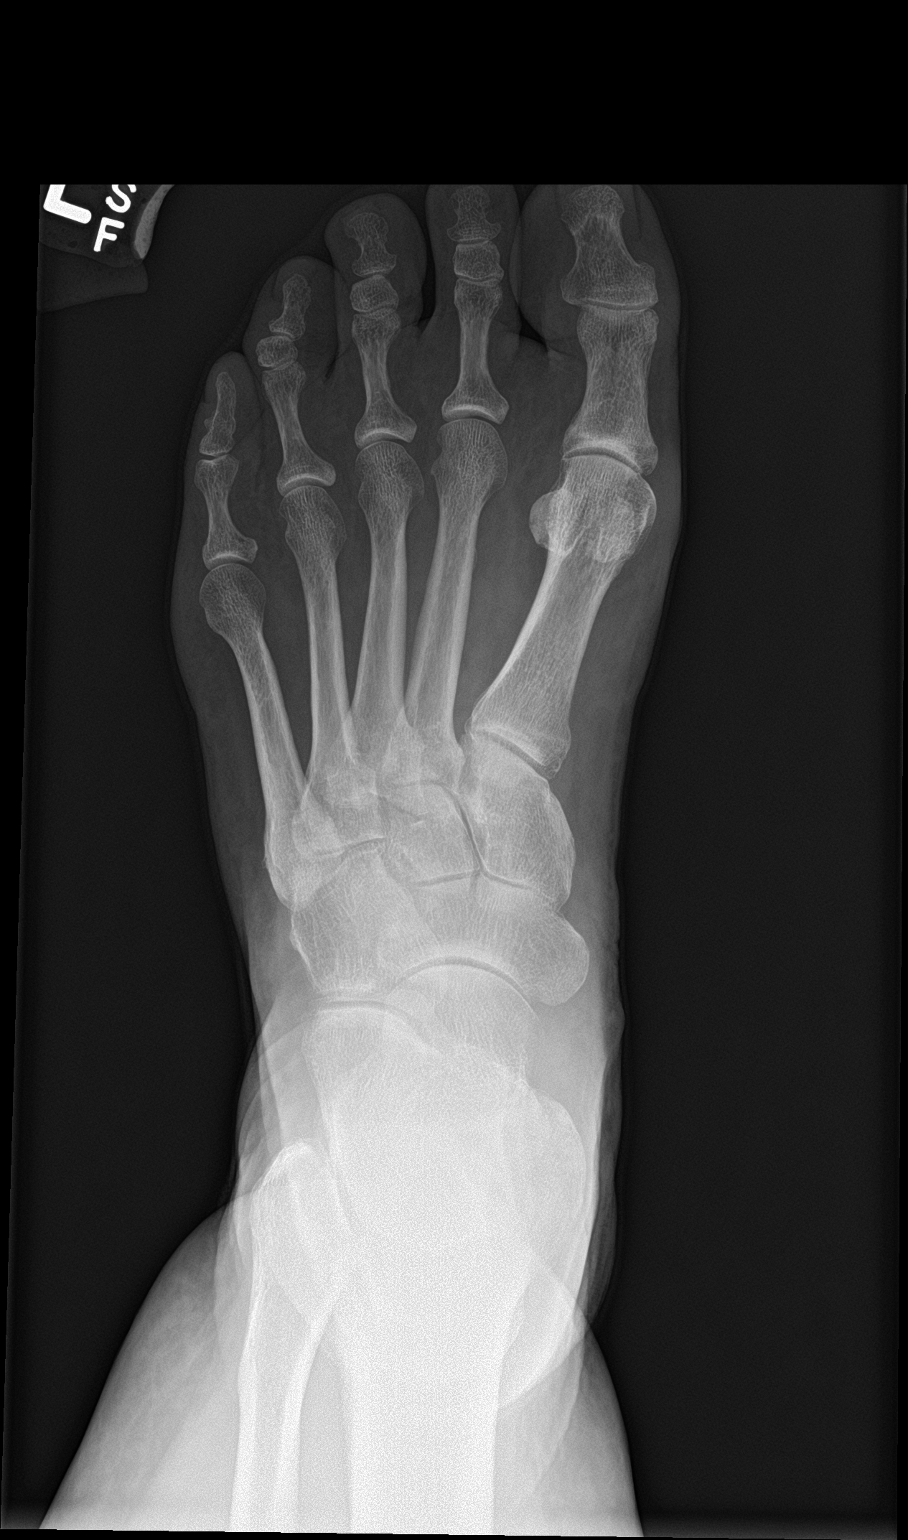

[foot obl wb]
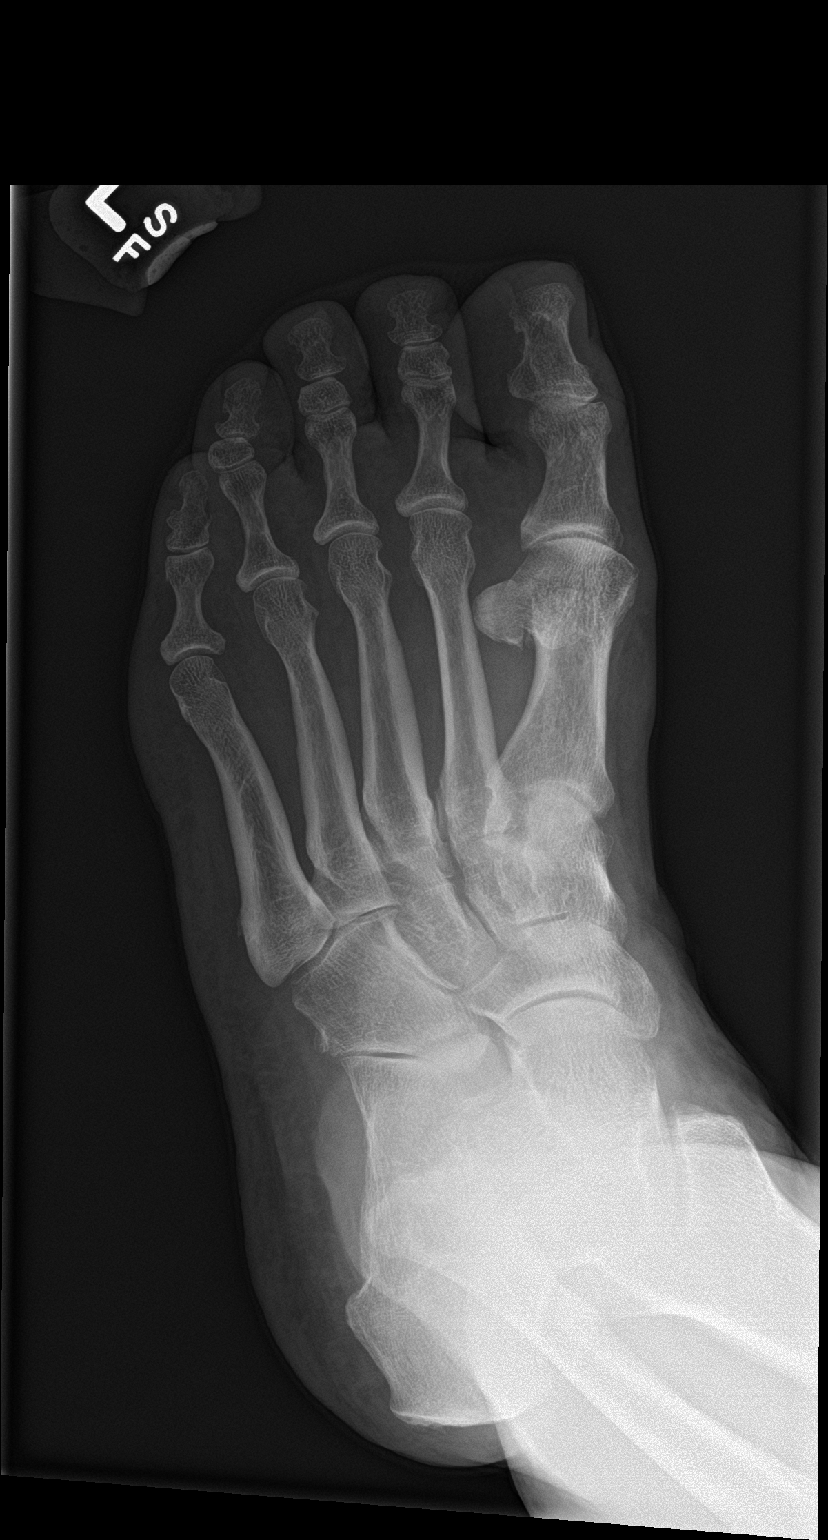

[foot lat wb]
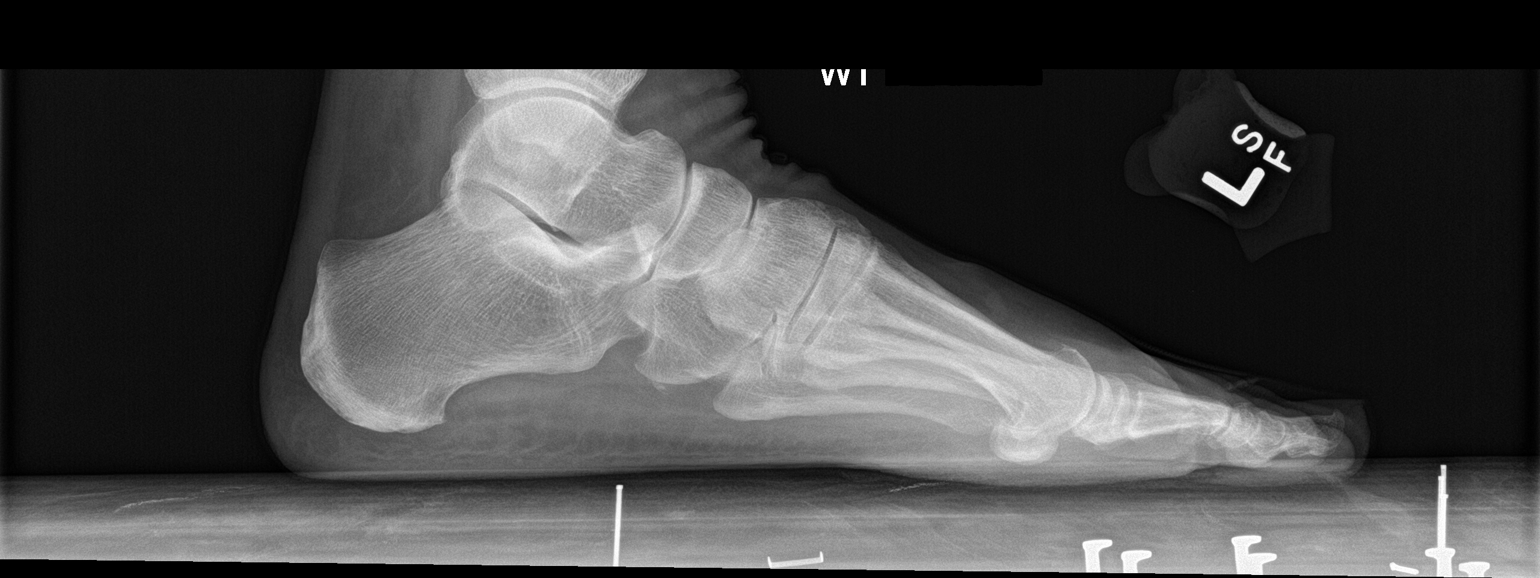

[3 of 3 positions shown; findings below may reference images not displayed]

FINDINGS: No fracture or dislocation of the bilateral feet. Bunion deformity
of the right great toe with minimal associated arthrosis. Joint
spaces are otherwise well preserved bilaterally. Normal alignment of
the midfoot on lateral weight-bearing views. Soft tissues are
unremarkable.
IMPRESSION: 1. No fracture or dislocation of the bilateral feet.
2. Bunion deformity of the right great toe with minimal associated
arthrosis. Joint spaces are otherwise well preserved bilaterally.

## 2023-08-07 DIAGNOSIS — S46002A Unspecified injury of muscle(s) and tendon(s) of the rotator cuff of left shoulder, initial encounter: Secondary | ICD-10-CM | POA: Diagnosis not present

## 2023-08-11 DIAGNOSIS — M25512 Pain in left shoulder: Secondary | ICD-10-CM | POA: Diagnosis not present

## 2023-08-17 DIAGNOSIS — M25512 Pain in left shoulder: Secondary | ICD-10-CM | POA: Diagnosis not present

## 2023-08-25 DIAGNOSIS — M25512 Pain in left shoulder: Secondary | ICD-10-CM | POA: Diagnosis not present

## 2023-09-01 DIAGNOSIS — M25512 Pain in left shoulder: Secondary | ICD-10-CM | POA: Diagnosis not present

## 2023-09-05 DIAGNOSIS — M25512 Pain in left shoulder: Secondary | ICD-10-CM | POA: Diagnosis not present

## 2023-09-11 DIAGNOSIS — S46002A Unspecified injury of muscle(s) and tendon(s) of the rotator cuff of left shoulder, initial encounter: Secondary | ICD-10-CM | POA: Diagnosis not present

## 2023-09-13 DIAGNOSIS — H35443 Age-related reticular degeneration of retina, bilateral: Secondary | ICD-10-CM | POA: Diagnosis not present

## 2023-09-13 DIAGNOSIS — D3132 Benign neoplasm of left choroid: Secondary | ICD-10-CM | POA: Diagnosis not present

## 2023-09-13 DIAGNOSIS — H2512 Age-related nuclear cataract, left eye: Secondary | ICD-10-CM | POA: Diagnosis not present

## 2023-09-13 DIAGNOSIS — H25811 Combined forms of age-related cataract, right eye: Secondary | ICD-10-CM | POA: Diagnosis not present

## 2023-09-14 DIAGNOSIS — H25813 Combined forms of age-related cataract, bilateral: Secondary | ICD-10-CM | POA: Diagnosis not present

## 2023-09-14 DIAGNOSIS — H35443 Age-related reticular degeneration of retina, bilateral: Secondary | ICD-10-CM | POA: Diagnosis not present

## 2023-09-14 DIAGNOSIS — D3131 Benign neoplasm of right choroid: Secondary | ICD-10-CM | POA: Diagnosis not present

## 2023-09-14 DIAGNOSIS — D3132 Benign neoplasm of left choroid: Secondary | ICD-10-CM | POA: Diagnosis not present

## 2023-09-15 DIAGNOSIS — M25512 Pain in left shoulder: Secondary | ICD-10-CM | POA: Diagnosis not present

## 2023-09-26 DIAGNOSIS — H25811 Combined forms of age-related cataract, right eye: Secondary | ICD-10-CM | POA: Diagnosis not present

## 2023-10-02 DIAGNOSIS — Z1231 Encounter for screening mammogram for malignant neoplasm of breast: Secondary | ICD-10-CM | POA: Diagnosis not present

## 2023-10-02 DIAGNOSIS — Z01419 Encounter for gynecological examination (general) (routine) without abnormal findings: Secondary | ICD-10-CM | POA: Diagnosis not present

## 2023-10-02 DIAGNOSIS — Z1331 Encounter for screening for depression: Secondary | ICD-10-CM | POA: Diagnosis not present

## 2023-10-10 DIAGNOSIS — M25412 Effusion, left shoulder: Secondary | ICD-10-CM | POA: Diagnosis not present

## 2023-10-10 DIAGNOSIS — M19012 Primary osteoarthritis, left shoulder: Secondary | ICD-10-CM | POA: Diagnosis not present

## 2023-10-10 DIAGNOSIS — S46012A Strain of muscle(s) and tendon(s) of the rotator cuff of left shoulder, initial encounter: Secondary | ICD-10-CM | POA: Diagnosis not present

## 2023-10-10 DIAGNOSIS — S43492A Other sprain of left shoulder joint, initial encounter: Secondary | ICD-10-CM | POA: Diagnosis not present

## 2023-10-11 HISTORY — PX: CATARACT EXTRACTION: SUR2

## 2023-10-31 DIAGNOSIS — H25811 Combined forms of age-related cataract, right eye: Secondary | ICD-10-CM | POA: Diagnosis not present

## 2023-10-31 DIAGNOSIS — H268 Other specified cataract: Secondary | ICD-10-CM | POA: Diagnosis not present

## 2023-11-06 DIAGNOSIS — H25812 Combined forms of age-related cataract, left eye: Secondary | ICD-10-CM | POA: Diagnosis not present

## 2023-11-08 DIAGNOSIS — M25512 Pain in left shoulder: Secondary | ICD-10-CM | POA: Diagnosis not present

## 2023-11-28 DIAGNOSIS — H25812 Combined forms of age-related cataract, left eye: Secondary | ICD-10-CM | POA: Diagnosis not present

## 2023-11-28 DIAGNOSIS — H268 Other specified cataract: Secondary | ICD-10-CM | POA: Diagnosis not present

## 2023-12-26 DIAGNOSIS — L570 Actinic keratosis: Secondary | ICD-10-CM | POA: Diagnosis not present

## 2023-12-26 DIAGNOSIS — D485 Neoplasm of uncertain behavior of skin: Secondary | ICD-10-CM | POA: Diagnosis not present

## 2024-01-05 DIAGNOSIS — M5416 Radiculopathy, lumbar region: Secondary | ICD-10-CM | POA: Diagnosis not present

## 2024-01-05 DIAGNOSIS — S39012A Strain of muscle, fascia and tendon of lower back, initial encounter: Secondary | ICD-10-CM | POA: Diagnosis not present

## 2024-01-23 DIAGNOSIS — M25562 Pain in left knee: Secondary | ICD-10-CM | POA: Diagnosis not present

## 2024-02-13 DIAGNOSIS — Z85828 Personal history of other malignant neoplasm of skin: Secondary | ICD-10-CM | POA: Diagnosis not present

## 2024-02-13 DIAGNOSIS — L820 Inflamed seborrheic keratosis: Secondary | ICD-10-CM | POA: Diagnosis not present

## 2024-02-16 DIAGNOSIS — Z96652 Presence of left artificial knee joint: Secondary | ICD-10-CM | POA: Diagnosis not present

## 2024-03-28 ENCOUNTER — Ambulatory Visit

## 2024-03-28 VITALS — BP 120/52 | HR 85 | Ht 64.5 in | Wt 187.0 lb

## 2024-03-28 DIAGNOSIS — Z0000 Encounter for general adult medical examination without abnormal findings: Secondary | ICD-10-CM | POA: Diagnosis not present

## 2024-03-28 NOTE — Patient Instructions (Signed)
  Amanda Walters , Thank you for taking time to come for your Medicare Wellness Visit. I appreciate your ongoing commitment to your health goals. Please review the following plan we discussed and let me know if I can assist you in the future.   These are the goals we discussed:  Goals       Patient Stated (pt-stated)      03/10/2021 AWV Goal: Exercise for General Health  Patient will verbalize understanding of the benefits of increased physical activity: Exercising regularly is important. It will improve your overall fitness, flexibility, and endurance. Regular exercise also will improve your overall health. It can help you control your weight, reduce stress, and improve your bone density. Over the next year, patient will increase physical activity as tolerated with a goal of at least 150 minutes of moderate physical activity per week.  You can tell that you are exercising at a moderate intensity if your heart starts beating faster and you start breathing faster but can still hold a conversation. Moderate-intensity exercise ideas include: Walking 1 mile (1.6 km) in about 15 minutes Biking Hiking Golfing Dancing Water aerobics Patient will verbalize understanding of everyday activities that increase physical activity by providing examples like the following: Yard work, such as: Insurance underwriter Gardening Washing windows or floors Patient will be able to explain general safety guidelines for exercising:  Before you start a new exercise program, talk with your health care provider. Do not exercise so much that you hurt yourself, feel dizzy, or get very short of breath. Wear comfortable clothes and wear shoes with good support. Drink plenty of water while you exercise to prevent dehydration or heat stroke. Work out until your breathing and your heartbeat get faster.       Patient Stated (pt-stated)       03/15/2022 AWV Goal: Improved Nutrition/Diet  Patient will verbalize understanding that diet plays an important role in overall health and that a poor diet is a risk factor for many chronic medical conditions.  Over the next year, patient will improve self management of their diet by incorporating more water and eat healthier. Patient will utilize available community resources to help with food acquisition if needed (ex: food pantries, [address], etc) Patient will work with nutrition specialist if a referral was made       Patient Stated (pt-stated)      Patient stated she would like to do eat a little better.      Patient Stated      Patient states she would like to finish her list of chairs.         This is a list of the screening recommended for you and due dates:  Health Maintenance  Topic Date Due   DEXA scan (bone density measurement)  Never done   Flu Shot  05/10/2024   Mammogram  09/14/2024   Medicare Annual Wellness Visit  03/28/2025   Colon Cancer Screening  07/15/2028   DTaP/Tdap/Td vaccine (3 - Td or Tdap) 02/15/2029   Pneumococcal Vaccine for age over 10  Completed   Hepatitis C Screening  Completed   Zoster (Shingles) Vaccine  Completed   HPV Vaccine  Aged Out   Meningitis B Vaccine  Aged Out   COVID-19 Vaccine  Discontinued   Cologuard (Stool DNA test)  Discontinued

## 2024-03-28 NOTE — Progress Notes (Signed)
 Subjective:   Amanda Walters is a 68 y.o. female who presents for Medicare Annual (Subsequent) preventive examination.  Visit Complete: In person  Patient Medicare AWV questionnaire was completed by the patient on n/a; I have confirmed that all information answered by patient is correct and no changes since this date.  Cardiac Risk Factors include: advanced age (>16men, >78 women);family history of premature cardiovascular disease     Objective:    Today's Vitals   03/28/24 1005  BP: (!) 147/72  Pulse: 85  SpO2: 97%  Weight: 187 lb (84.8 kg)  Height: 5' 4.5 (1.638 m)   Body mass index is 31.6 kg/m.     03/28/2024   10:23 AM 03/20/2023    2:06 PM 03/15/2022    1:10 PM 03/10/2021    1:22 PM  Advanced Directives  Does Patient Have a Medical Advance Directive? Yes No No No  Type of Advance Directive Living will     Does patient want to make changes to medical advance directive? No - Patient declined     Would patient like information on creating a medical advance directive?  No - Patient declined No - Patient declined No - Patient declined    Current Medications (verified) Outpatient Encounter Medications as of 03/28/2024  Medication Sig   ascorbic acid (VITAMIN C) 500 MG tablet Vitamin C 500 mg tablet   2 tablets every day by oral route.   Biotin 1000 MCG tablet Take 1,000 mcg by mouth daily.   Calcium Citrate (CITRACAL PO) Take 2 tablets by mouth daily.   Cholecalciferol (VITAMIN D3) 125 MCG (5000 UT) CAPS Take 5,000 Units by mouth daily.   estradiol (ESTRACE) 1 MG tablet 1.5 mg daily in the afternoon.   gabapentin  (NEURONTIN ) 100 MG capsule Take 1 capsule (100 mg total) by mouth 3 (three) times daily.   Loratadine 10 MG CAPS Take by mouth every morning.   MAGNESIUM PO Take 250 mg by mouth daily.   methocarbamol (ROBAXIN) 500 MG tablet Take 1 tablet by mouth every 8 (eight) hours.   Multiple Vitamins-Minerals (MULTIVITAMIN PO) Take 2 tablets by mouth daily. One a day  pedit   Multiple Vitamins-Minerals (ZINC PO) Take 50 mg by mouth daily.   omeprazole (PRILOSEC) 20 MG capsule Take 20 mg by mouth daily.   vitamin B-12 (CYANOCOBALAMIN ) 1000 MCG tablet Take 1,500 mcg by mouth daily.   terbinafine  (LAMISIL ) 250 MG tablet Take 1 tablet (250 mg total) by mouth daily. (Patient not taking: Reported on 03/28/2024)   No facility-administered encounter medications on file as of 03/28/2024.    Allergies (verified) Misc. sulfonamide containing compounds, Morphine and codeine, Sulfa antibiotics, Morphine, and Cephalexin   History: Past Medical History:  Diagnosis Date   COVID-19 06/05/2020   Fever due to COVID-19 06/05/2020   GERD (gastroesophageal reflux disease)    Past Surgical History:  Procedure Laterality Date   ABDOMINAL HYSTERECTOMY  1993   CATARACT EXTRACTION Bilateral 2025   CHOLECYSTECTOMY  2012   [facility] Kinsman   COLONOSCOPY     Around 2007 [facility] Hilma Lucks [facility] now   HAND SURGERY Right 10/2022   HERNIA REPAIR     ventral hernia    KNEE ARTHROSCOPY  2006   KNEE ARTHROSCOPY  2012   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   TOTAL KNEE ARTHROPLASTY  2011   Family History  Problem Relation Age of Onset   Heart disease Mother  Heart attack Other        mother    Colon cancer Neg Hx    Esophageal cancer Neg Hx    Social History   Socioeconomic History   Marital status: Single    Spouse name: Not on file   Number of children: 0   Years of education: 81   Highest education level: Master's degree (e.g., MA, MS, MEng, MEd, MSW, MBA)  Occupational History   Occupation: Nurse, learning disability    Comment: Retired  Tobacco Use   Smoking status: Never   Smokeless tobacco: Never  Vaping Use   Vaping status: Never Used  Substance and Sexual Activity   Alcohol use: Not Currently   Drug use: Never   Sexual activity: Not Currently  Other Topics Concern   Not on file  Social History  Narrative   Lives alone. She enjoys yardwork, croteching, baking and reading.    Social Drivers of Corporate investment banker Strain: Low Risk  (03/28/2024)   Overall Financial Resource Strain (CARDIA)    Difficulty of Paying Living Expenses: Not hard at all  Food Insecurity: No Food Insecurity (03/28/2024)   Hunger Vital Sign    Worried About Running Out of Food in the Last Year: Never true    Ran Out of Food in the Last Year: Never true  Transportation Needs: No Transportation Needs (03/28/2024)   PRAPARE - Administrator, Civil Service (Medical): No    Lack of Transportation (Non-Medical): No  Physical Activity: Sufficiently Active (03/28/2024)   Exercise Vital Sign    Days of Exercise per Week: 3 days    Minutes of Exercise per Session: 60 min  Stress: No Stress Concern Present (03/28/2024)   [employer] of Occupational Health - Occupational Stress Questionnaire    Feeling of Stress: Not at all  Social Connections: Socially Integrated (03/28/2024)   Social Connection and Isolation Panel    Frequency of Communication with Friends and Family: More than three times a week    Frequency of Social Gatherings with Friends and Family: More than three times a week    Attends Religious Services: More than 4 times per year    Active Member of [employer] or Organizations: Yes    Attends Engineer, structural: More than 4 times per year    Marital Status: Married    Tobacco Counseling Counseling given: Not Answered   Clinical Intake:  Pre-visit preparation completed: Yes  Pain : No/denies pain     BMI - recorded: 31.6 Nutritional Status: BMI > 30  Obese Nutritional Risks: None Diabetes: No  How often do you need to have someone help you when you read instructions, pamphlets, or other written materials from your doctor or pharmacy?: 1 - Never What is the last grade level you completed in school?: 18  Interpreter Needed?: No      Activities of Daily  Living    03/28/2024   10:08 AM  In your present state of health, do you have any difficulty performing the following activities:  Hearing? 1  Comment She is going to have her hearing checked.  Vision? 0  Difficulty concentrating or making decisions? 0  Walking or climbing stairs? 0  Dressing or bathing? 0  Doing errands, shopping? 0  Preparing Food and eating ? N  Using the Toilet? N  In the past six months, have you accidently leaked urine? N  Do you have problems with loss of bowel control? N  Managing  your Medications? N  Managing your Finances? N  Housekeeping or managing your Housekeeping? N    Patient Care Team: Cherre Cornish, NP as PCP - General (Nurse Practitioner) Liliane Rei, MD as Consulting Physician (Orthopedic Surgery) Glory Larsen, MD as Referring Physician (Dermatology)  Indicate any recent Medical Services you may have received from other than [facility] providers in the past year (date may be approximate).     Assessment:   This is a routine wellness examination for [place].  Hearing/Vision screen No results found.   Goals Addressed             This Visit's Progress    Patient Stated       Patient states she would like to finish her list of chairs.        Depression Screen    03/28/2024   10:21 AM 03/20/2023    2:07 PM 08/12/2022    8:41 AM 07/18/2022    1:40 PM 03/15/2022    1:10 PM 02/23/2022    8:42 AM 03/10/2021    1:28 PM  PHQ 2/9 Scores  PHQ - 2 Score 0 0 0 0 0 0 0    Fall Risk    03/28/2024   10:23 AM 03/20/2023    2:06 PM 08/12/2022    8:40 AM 07/18/2022    1:39 PM 03/15/2022    1:14 PM  Fall Risk   Falls in the past year? 1 0 0 0 0  Number falls in past yr: 0 0 0 0 0  Injury with Fall? 1 0 0 0 0  Risk for fall due to : Impaired mobility No Fall Risks No Fall Risks No Fall Risks No Fall Risks  Follow up Falls evaluation completed Falls evaluation completed Falls evaluation completed  Falls evaluation completed  Falls evaluation  completed      Data saved with a previous flowsheet row definition    MEDICARE RISK AT HOME: Medicare Risk at Home Any stairs in or around the home?: Yes If so, are there any without handrails?: Yes Home free of loose throw rugs in walkways, pet beds, electrical cords, etc?: Yes Adequate lighting in your home to reduce risk of falls?: Yes Life alert?: No Use of a cane, walker or w/c?: No Grab bars in the bathroom?: No Shower chair or bench in shower?: No Elevated toilet seat or a handicapped toilet?: Yes  TIMED UP AND GO:  Was the test performed?  Yes  Length of time to ambulate 10 feet: 8 sec Gait steady and fast without use of assistive device    Cognitive Function:        03/28/2024   10:25 AM 03/20/2023    2:12 PM 03/15/2022    1:17 PM 03/10/2021    1:34 PM  6CIT Screen  What Year? 0 points 0 points 0 points 0 points  What month? 0 points 0 points 0 points 0 points  What time? 0 points 0 points 0 points 0 points  Count back from 20 0 points 0 points 0 points 0 points  Months in reverse 0 points 0 points 0 points 0 points  Repeat phrase 0 points 0 points 0 points 0 points  Total Score 0 points 0 points 0 points 0 points    Immunizations Immunization History  Administered Date(s) Administered   Fluad Quad(high Dose 65+) 09/14/2020   Influenza,inj,Quad PF,6+ Mos 07/11/2017, 07/16/2018, 07/24/2019   Influenza-Unspecified 07/10/2014   PNEUMOCOCCAL CONJUGATE-20 03/10/2021   PPD Test 11/25/2019  Tdap 06/11/2016, 02/16/2019   Zoster Recombinant(Shingrix ) 07/11/2017, 09/14/2017    TDAP status: Up to date  Flu Vaccine status: Declined, Education has been provided regarding the importance of this vaccine but patient still declined. Advised may receive this vaccine at local pharmacy or Health Dept. Aware to provide a copy of the vaccination record if obtained from local pharmacy or Health Dept. Verbalized acceptance and understanding.  Pneumococcal vaccine status: Up to  date  Covid-19 vaccine status: Declined, Education has been provided regarding the importance of this vaccine but patient still declined. Advised may receive this vaccine at local pharmacy or Health Dept.or vaccine clinic. Aware to provide a copy of the vaccination record if obtained from local pharmacy or Health Dept. Verbalized acceptance and understanding.  Qualifies for Shingles Vaccine? Yes   Zostavax completed No   Shingrix  Completed?: Yes  Screening Tests Health Maintenance  Topic Date Due   DEXA SCAN  Never done   INFLUENZA VACCINE  05/10/2024   MAMMOGRAM  09/14/2024   Medicare Annual Wellness (AWV)  03/28/2025   Colonoscopy  07/15/2028   DTaP/Tdap/Td (3 - Td or Tdap) 02/15/2029   Pneumococcal Vaccine: 50+ Years  Completed   Hepatitis C Screening  Completed   Zoster Vaccines- Shingrix   Completed   HPV VACCINES  Aged Out   Meningococcal B Vaccine  Aged Out   COVID-19 Vaccine  Discontinued   Fecal DNA (Cologuard)  Discontinued    Health Maintenance  Health Maintenance Due  Topic Date Due   DEXA SCAN  Never done    Colorectal cancer screening: Type of screening: Colonoscopy. Completed 07/15/2021. Repeat every 7 years  Mammogram status: Completed 2024. Repeat every year  Bone Density status: Completed 2024. Results reflect: Bone density results: NORMAL. Repeat every 2 years.  Lung Cancer Screening: (Low Dose CT Chest recommended if Age 62-80 years, 20 pack-year currently smoking OR have quit w/in 15years.) does not qualify.   Lung Cancer Screening Referral: n/a  Additional Screening:  Hepatitis C Screening: does qualify; Completed 07/11/2017  Vision Screening: Recommended annual ophthalmology exams for early detection of glaucoma and other disorders of the eye. Is the patient up to date with their annual eye exam?  Yes  Who is the provider or what is the name of the office in which the patient attends annual eye exams? Dr Daneil Dunker If pt is not established with a  provider, would they like to be referred to a provider to establish care? N/a.   Dental Screening: Recommended annual dental exams for proper oral hygiene   Community Resource Referral / Chronic Care Management: CRR required this visit?  No   CCM required this visit?  No     Plan:     I have personally reviewed and noted the following in the patient's chart:   Medical and social history Use of alcohol, tobacco or illicit drugs  Current medications and supplements including opioid prescriptions. Patient is not currently taking opioid prescriptions. Functional ability and status Nutritional status Physical activity Advanced directives List of other physicians Hospitalizations, surgeries, and ER visits in previous 12 months. None Vitals Screenings to include cognitive, depression, and falls Referrals and appointments  In addition, I have reviewed and discussed with patient certain preventive protocols, quality metrics, and best practice recommendations. A written personalized care plan for preventive services as well as general preventive health recommendations were provided to patient.     Aubrey Leaf, CMA   03/28/2024   After Visit Summary: (In Person-Printed) AVS printed  and given to the patient  Nurse Notes:

## 2024-03-29 ENCOUNTER — Encounter: Payer: Self-pay | Admitting: Medical-Surgical

## 2024-05-22 ENCOUNTER — Other Ambulatory Visit: Payer: Self-pay | Admitting: Medical-Surgical

## 2024-06-20 ENCOUNTER — Other Ambulatory Visit: Payer: Self-pay | Admitting: Medical-Surgical

## 2024-07-18 ENCOUNTER — Ambulatory Visit (INDEPENDENT_AMBULATORY_CARE_PROVIDER_SITE_OTHER): Admitting: Medical-Surgical

## 2024-07-18 ENCOUNTER — Encounter: Payer: Self-pay | Admitting: Medical-Surgical

## 2024-07-18 VITALS — BP 134/69 | HR 75 | Resp 20 | Ht 64.5 in | Wt 182.0 lb

## 2024-07-18 DIAGNOSIS — Z780 Asymptomatic menopausal state: Secondary | ICD-10-CM | POA: Diagnosis not present

## 2024-07-18 DIAGNOSIS — Z1231 Encounter for screening mammogram for malignant neoplasm of breast: Secondary | ICD-10-CM | POA: Diagnosis not present

## 2024-07-18 DIAGNOSIS — Z0000 Encounter for general adult medical examination without abnormal findings: Secondary | ICD-10-CM

## 2024-07-18 MED ORDER — GABAPENTIN 100 MG PO CAPS: 100.0000 mg | ORAL_CAPSULE | Freq: Three times a day (TID) | ORAL | 3 refills | Status: AC

## 2024-07-18 NOTE — Progress Notes (Signed)
 Complete physical exam  Patient: Amanda Walters   DOB: [birth year]   68 y.o. Female  MRN: [id number]  Subjective:    Chief Complaint  Patient presents with   Annual Exam    Amanda Walters is a 68 y.o. female who presents today for a complete physical exam. She reports consuming a general diet. Swimming three times weekly. She generally feels well. She reports sleeping well. She does not have additional problems to discuss today.    Most recent fall risk assessment:    03/28/2024   10:23 AM  Fall Risk   Falls in the past year? 1  Number falls in past yr: 0  Injury with Fall? 1  Risk for fall due to : Impaired mobility  Follow up Falls evaluation completed     Most recent depression screenings:    03/28/2024   10:21 AM 03/20/2023    2:07 PM  PHQ 2/9 Scores  PHQ - 2 Score 0 0    Vision:Within last year and Dental: No current dental problems and Receives regular dental care    Patient Care Team: Willo Mini, NP as PCP - General (Nurse Practitioner) Melodi Lerner, MD as Consulting Physician (Orthopedic Surgery) Court Pulling, MD as Referring Physician (Dermatology) Law, Cassandra A, DO (Inactive) as Consulting Physician (Obstetrics and Gynecology)   Outpatient Medications Prior to Visit  Medication Sig   ascorbic acid (VITAMIN C) 500 MG tablet Vitamin C 500 mg tablet   2 tablets every day by oral route.   Biotin 1000 MCG tablet Take 1,000 mcg by mouth daily.   Calcium Citrate (CITRACAL PO) Take 2 tablets by mouth daily.   Cholecalciferol (VITAMIN D3) 125 MCG (5000 UT) CAPS Take 5,000 Units by mouth daily.   estradiol (ESTRACE) 1 MG tablet 1.5 mg daily in the afternoon.   Loratadine 10 MG CAPS Take by mouth every morning.   MAGNESIUM PO Take 250 mg by mouth daily.   methocarbamol (ROBAXIN) 500 MG tablet Take 1 tablet by mouth every 8 (eight) hours.   Multiple Vitamins-Minerals (MULTIVITAMIN PO) Take 2 tablets by mouth daily. One a day pedit   Multiple  Vitamins-Minerals (ZINC PO) Take 50 mg by mouth daily.   omeprazole (PRILOSEC) 20 MG capsule Take 20 mg by mouth daily.   terbinafine  (LAMISIL ) 250 MG tablet Take 1 tablet (250 mg total) by mouth daily. (Patient not taking: Reported on 03/28/2024)   vitamin B-12 (CYANOCOBALAMIN ) 1000 MCG tablet Take 1,500 mcg by mouth daily.   [DISCONTINUED] gabapentin  (NEURONTIN ) 100 MG capsule Take 1 capsule (100 mg total) by mouth 3 (three) times daily. NEEDS APPOINTMENT FOR FURTHER REFILLS.   No facility-administered medications prior to visit.    Review of Systems  Constitutional:  Negative for chills, fever, malaise/fatigue and weight loss.  HENT:  Positive for congestion. Negative for ear pain, hearing loss, sinus pain and sore throat.        Right ear fullness   Eyes:  Negative for blurred vision, photophobia and pain.  Respiratory:  Positive for cough. Negative for shortness of breath and wheezing.   Cardiovascular:  Negative for chest pain, palpitations and leg swelling.  Gastrointestinal:  Negative for abdominal pain, constipation, diarrhea, heartburn, nausea and vomiting.  Genitourinary:  Negative for dysuria, frequency and urgency.  Musculoskeletal:  Positive for back pain. Negative for falls and neck pain.  Skin:  Negative for itching and rash.  Neurological:  Negative for dizziness, weakness and headaches.  Endo/Heme/Allergies:  Negative for polydipsia. Does  not bruise/bleed easily.  Psychiatric/Behavioral:  Negative for depression, substance abuse and suicidal ideas. The patient is not nervous/anxious.      Objective:     BP 134/69 (BP Location: Left Arm, Cuff Size: Normal)   Pulse 75   Resp 20   Ht 5' 4.5 (1.638 m)   Wt 182 lb (82.6 kg)   SpO2 97%   BMI 30.76 kg/m    Physical Exam Vitals reviewed.  Constitutional:      General: She is not in acute distress.    Appearance: Normal appearance. She is not ill-appearing.  HENT:     Head: Normocephalic and atraumatic.     Right  Ear: Tympanic membrane, ear canal and external ear normal. There is no impacted cerumen.     Left Ear: Tympanic membrane, ear canal and external ear normal. There is no impacted cerumen.     Nose: Nose normal. No congestion or rhinorrhea.     Mouth/Throat:     Mouth: Mucous membranes are moist.     Pharynx: No oropharyngeal exudate or posterior oropharyngeal erythema.  Eyes:     General: No scleral icterus.       Right eye: No discharge.        Left eye: No discharge.     Extraocular Movements: Extraocular movements intact.     Conjunctiva/sclera: Conjunctivae normal.     Pupils: Pupils are equal, round, and reactive to light.  Neck:     Thyroid : No thyromegaly.     Vascular: No carotid bruit or JVD.     Trachea: Trachea normal.  Cardiovascular:     Rate and Rhythm: Normal rate and regular rhythm.     Pulses: Normal pulses.     Heart sounds: Normal heart sounds. No murmur heard.    No friction rub. No gallop.  Pulmonary:     Effort: Pulmonary effort is normal. No respiratory distress.     Breath sounds: Normal breath sounds. No wheezing.  Abdominal:     General: Bowel sounds are normal. There is no distension.     Palpations: Abdomen is soft.     Tenderness: There is no abdominal tenderness. There is no guarding.  Musculoskeletal:        General: Normal range of motion.     Cervical back: Normal range of motion and neck supple.  Lymphadenopathy:     Cervical: No cervical adenopathy.  Skin:    General: Skin is warm and dry.  Neurological:     Mental Status: She is alert and oriented to person, place, and time.     Cranial Nerves: No cranial nerve deficit.  Psychiatric:        Mood and Affect: Mood normal.        Behavior: Behavior normal.        Thought Content: Thought content normal.        Judgment: Judgment normal.   No results found for any visits on 07/18/24.     Assessment & Plan:    Routine Health Maintenance and Physical Exam  Immunization History   Administered Date(s) Administered   Fluad Quad(high Dose 65+) 09/14/2020   Influenza,inj,Quad PF,6+ Mos 07/11/2017, 07/16/2018, 07/24/2019   Influenza-Unspecified 07/10/2014   PNEUMOCOCCAL CONJUGATE-20 03/10/2021   PPD Test 11/25/2019   Tdap 06/11/2016, 02/16/2019   Zoster Recombinant(Shingrix ) 07/11/2017, 09/14/2017    Health Maintenance  Topic Date Due   DEXA SCAN  Never done   Mammogram  09/14/2024   Influenza Vaccine  01/07/2025 (Originally 05/10/2024)  Medicare Annual Wellness (AWV)  03/28/2025   Colonoscopy  07/15/2028   DTaP/Tdap/Td (3 - Td or Tdap) 02/15/2029   Pneumococcal Vaccine: 50+ Years  Completed   Hepatitis C Screening  Completed   Zoster Vaccines- Shingrix   Completed   Meningococcal B Vaccine  Aged Out   COVID-19 Vaccine  Discontinued   Fecal DNA (Cologuard)  Discontinued    Discussed health benefits of physical activity, and encouraged her to engage in regular exercise appropriate for her age and condition.  1. Annual physical exam (Primary) Checking labs as below. UTD on preventative care. Wellness information provided with AVS. - CBC with Differential/Platelet - CMP14+EGFR - Lipid panel  2. Postmenopausal DEXA scan ordered.  - DG Bone Density; Future  3. Encounter for screening mammogram for malignant neoplasm of breast Has these done with annual GYN visits. Declined today.   Return in about 1 year (around 07/18/2025) for annual physical exam.   Takela Varden, NP

## 2024-07-18 NOTE — Patient Instructions (Signed)
 Preventive Care 83 Years and Older, Female Preventive care refers to lifestyle choices and visits with your health care provider that can promote health and wellness. Preventive care visits are also called wellness exams. What can I expect for my preventive care visit? Counseling Your health care provider may ask you questions about your: Medical history, including: Past medical problems. Family medical history. Pregnancy and menstrual history. History of falls. Current health, including: Memory and ability to understand (cognition). Emotional well-being. Home life and relationship well-being. Sexual activity and sexual health. Lifestyle, including: Alcohol, nicotine or tobacco, and drug use. Access to firearms. Diet, exercise, and sleep habits. Work and work Astronomer. Sunscreen use. Safety issues such as seatbelt and bike helmet use. Physical exam Your health care provider will check your: Height and weight. These may be used to calculate your BMI (body mass index). BMI is a measurement that tells if you are at a healthy weight. Waist circumference. This measures the distance around your waistline. This measurement also tells if you are at a healthy weight and may help predict your risk of certain diseases, such as type 2 diabetes and high blood pressure. Heart rate and blood pressure. Body temperature. Skin for abnormal spots. What immunizations do I need?  Vaccines are usually given at various ages, according to a schedule. Your health care provider will recommend vaccines for you based on your age, medical history, and lifestyle or other factors, such as travel or where you work. What tests do I need? Screening Your health care provider may recommend screening tests for certain conditions. This may include: Lipid and cholesterol levels. Hepatitis C test. Hepatitis B test. HIV (human immunodeficiency virus) test. STI (sexually transmitted infection) testing, if you are at  risk. Lung cancer screening. Colorectal cancer screening. Diabetes screening. This is done by checking your blood sugar (glucose) after you have not eaten for a while (fasting). Mammogram. Talk with your health care provider about how often you should have regular mammograms. BRCA-related cancer screening. This may be done if you have a family history of breast, ovarian, tubal, or peritoneal cancers. Bone density scan. This is done to screen for osteoporosis. Talk with your health care provider about your test results, treatment options, and if necessary, the need for more tests. Follow these instructions at home: Eating and drinking  Eat a diet that includes fresh fruits and vegetables, whole grains, lean protein, and low-fat dairy products. Limit your intake of foods with high amounts of sugar, saturated fats, and salt. Take vitamin and mineral supplements as recommended by your health care provider. Do not drink alcohol if your health care provider tells you not to drink. If you drink alcohol: Limit how much you have to 0-1 drink a day. Know how much alcohol is in your drink. In the [place] one drink equals one 12 oz bottle of beer (355 mL), one 5 oz glass of wine (148 mL), or one 1 oz glass of hard liquor (44 mL). Lifestyle Brush your teeth every morning and night with fluoride toothpaste. Floss one time each day. Exercise for at least 30 minutes 5 or more days each week. Do not use any products that contain nicotine or tobacco. These products include cigarettes, chewing tobacco, and vaping devices, such as e-cigarettes. If you need help quitting, ask your health care provider. Do not use drugs. If you are sexually active, practice safe sex. Use a condom or other form of protection in order to prevent STIs. Take aspirin only as told by  your health care provider. Make sure that you understand how much to take and what form to take. Work with your health care provider to find out whether it  is safe and beneficial for you to take aspirin daily. Ask your health care provider if you need to take a cholesterol-lowering medicine (statin). Find healthy ways to manage stress, such as: Meditation, yoga, or listening to music. Journaling. Talking to a trusted person. Spending time with friends and family. Minimize exposure to UV radiation to reduce your risk of skin cancer. Safety Always wear your seat belt while driving or riding in a vehicle. Do not drive: If you have been drinking alcohol. Do not ride with someone who has been drinking. When you are tired or distracted. While texting. If you have been using any mind-altering substances or drugs. Wear a helmet and other protective equipment during sports activities. If you have firearms in your house, make sure you follow all gun safety procedures. What's next? Visit your health care provider once a year for an annual wellness visit. Ask your health care provider how often you should have your eyes and teeth checked. Stay up to date on all vaccines. This information is not intended to replace advice given to you by your health care provider. Make sure you discuss any questions you have with your health care provider. Document Revised: 03/24/2021 Document Reviewed: 03/24/2021 Elsevier Patient Education  2024 [employer].

## 2024-07-19 ENCOUNTER — Ambulatory Visit: Payer: Self-pay | Admitting: Medical-Surgical

## 2024-07-19 LAB — CMP14+EGFR
ALT: 25 IU/L (ref 0–32)
AST: 30 IU/L (ref 0–40)
Albumin: 4.2 g/dL (ref 3.9–4.9)
Alkaline Phosphatase: 90 IU/L (ref 49–135)
BUN/Creatinine Ratio: 19 (ref 12–28)
BUN: 20 mg/dL (ref 8–27)
Bilirubin Total: <0.2 mg/dL (ref 0.0–1.2)
CO2: 22 mmol/L (ref 20–29)
Calcium: 9.3 mg/dL (ref 8.7–10.3)
Chloride: 102 mmol/L (ref 96–106)
Creatinine, Ser: 1.06 mg/dL — ABNORMAL HIGH (ref 0.57–1.00)
Globulin, Total: 2.6 g/dL (ref 1.5–4.5)
Glucose: 93 mg/dL (ref 70–99)
Potassium: 4.2 mmol/L (ref 3.5–5.2)
Sodium: 140 mmol/L (ref 134–144)
Total Protein: 6.8 g/dL (ref 6.0–8.5)
eGFR: 57 mL/min/1.73 — ABNORMAL LOW (ref >59)

## 2024-07-19 LAB — LIPID PANEL
Chol/HDL Ratio: 4.6 ratio — ABNORMAL HIGH (ref 0.0–4.4)
Cholesterol, Total: 176 mg/dL (ref 100–199)
HDL: 38 mg/dL — ABNORMAL LOW (ref >39)
LDL Chol Calc (NIH): 110 mg/dL — ABNORMAL HIGH (ref 0–99)
Triglycerides: 155 mg/dL — ABNORMAL HIGH (ref 0–149)
VLDL Cholesterol Cal: 28 mg/dL (ref 5–40)

## 2024-07-19 LAB — CBC WITH DIFFERENTIAL/PLATELET
Basophils Absolute: 0.1 x10E3/uL (ref 0.0–0.2)
Basos: 1 %
EOS (ABSOLUTE): 1 x10E3/uL — ABNORMAL HIGH (ref 0.0–0.4)
Eos: 14 %
Hematocrit: 35.7 % (ref 34.0–46.6)
Hemoglobin: 11.6 g/dL (ref 11.1–15.9)
Immature Grans (Abs): 0 x10E3/uL (ref 0.0–0.1)
Immature Granulocytes: 0 %
Lymphocytes Absolute: 1.9 x10E3/uL (ref 0.7–3.1)
Lymphs: 27 %
MCH: 30.4 pg (ref 26.6–33.0)
MCHC: 32.5 g/dL (ref 31.5–35.7)
MCV: 94 fL (ref 79–97)
Monocytes Absolute: 0.6 x10E3/uL (ref 0.1–0.9)
Monocytes: 9 %
Neutrophils Absolute: 3.5 x10E3/uL (ref 1.4–7.0)
Neutrophils: 49 %
Platelets: 256 x10E3/uL (ref 150–450)
RBC: 3.82 x10E6/uL (ref 3.77–5.28)
RDW: 12.9 % (ref 11.7–15.4)
WBC: 7.1 x10E3/uL (ref 3.4–10.8)

## 2024-08-01 ENCOUNTER — Other Ambulatory Visit: Payer: Self-pay | Admitting: Medical-Surgical

## 2024-08-01 DIAGNOSIS — D485 Neoplasm of uncertain behavior of skin: Secondary | ICD-10-CM | POA: Diagnosis not present

## 2024-08-01 DIAGNOSIS — I788 Other diseases of capillaries: Secondary | ICD-10-CM | POA: Diagnosis not present

## 2024-08-01 DIAGNOSIS — L570 Actinic keratosis: Secondary | ICD-10-CM | POA: Diagnosis not present

## 2024-08-01 DIAGNOSIS — D1801 Hemangioma of skin and subcutaneous tissue: Secondary | ICD-10-CM | POA: Diagnosis not present

## 2024-08-01 DIAGNOSIS — N289 Disorder of kidney and ureter, unspecified: Secondary | ICD-10-CM | POA: Diagnosis not present

## 2024-08-01 DIAGNOSIS — L814 Other melanin hyperpigmentation: Secondary | ICD-10-CM | POA: Diagnosis not present

## 2024-08-01 DIAGNOSIS — L821 Other seborrheic keratosis: Secondary | ICD-10-CM | POA: Diagnosis not present

## 2024-08-01 DIAGNOSIS — Z85828 Personal history of other malignant neoplasm of skin: Secondary | ICD-10-CM | POA: Diagnosis not present

## 2024-08-02 ENCOUNTER — Ambulatory Visit: Payer: Self-pay | Admitting: Medical-Surgical

## 2024-08-02 DIAGNOSIS — R040 Epistaxis: Secondary | ICD-10-CM

## 2024-08-02 LAB — BASIC METABOLIC PANEL WITH GFR
BUN/Creatinine Ratio: 9 — ABNORMAL LOW (ref 12–28)
BUN: 10 mg/dL (ref 8–27)
CO2: 22 mmol/L (ref 20–29)
Calcium: 9.5 mg/dL (ref 8.7–10.3)
Chloride: 103 mmol/L (ref 96–106)
Creatinine, Ser: 1.08 mg/dL — ABNORMAL HIGH (ref 0.57–1.00)
Glucose: 106 mg/dL — ABNORMAL HIGH (ref 70–99)
Potassium: 4.3 mmol/L (ref 3.5–5.2)
Sodium: 139 mmol/L (ref 134–144)
eGFR: 56 mL/min/1.73 — ABNORMAL LOW (ref >59)

## 2024-08-12 ENCOUNTER — Ambulatory Visit (INDEPENDENT_AMBULATORY_CARE_PROVIDER_SITE_OTHER): Admitting: Medical-Surgical

## 2024-08-12 ENCOUNTER — Encounter: Payer: Self-pay | Admitting: Medical-Surgical

## 2024-08-12 ENCOUNTER — Ambulatory Visit: Admitting: Medical-Surgical

## 2024-08-12 VITALS — BP 103/62 | HR 70 | Resp 20 | Ht 64.5 in | Wt 178.0 lb

## 2024-08-12 DIAGNOSIS — R040 Epistaxis: Secondary | ICD-10-CM | POA: Diagnosis not present

## 2024-08-12 DIAGNOSIS — J0100 Acute maxillary sinusitis, unspecified: Secondary | ICD-10-CM | POA: Diagnosis not present

## 2024-08-12 MED ORDER — AZITHROMYCIN 250 MG PO TABS: ORAL_TABLET | ORAL | 0 refills | Status: AC

## 2024-08-12 NOTE — Progress Notes (Signed)
        Established patient visit   History of Present Illness   Discussed the use of AI scribe software for clinical note transcription with the patient, who gave verbal consent to proceed.  History of Present Illness   Amanda Walters is a 68 year old female who presents with left-sided sinus pain and recurrent epistaxis.  Sinus pain and nasal burning - Left-sided sinus pain and burning present for several days, particularly after several episodes of epistaxis - Pain described as nagging and obnoxious - No ear pain, but persistent tickle in the right ear - Uses saline spray multiple times daily and a humidifier to maintain moisture - Has avoided using wood stove since onset of epistaxis  Epistaxis - Recurrent episodes of epistaxis occurred on Wednesday and Thursday - Most severe episode on Saturday with blood from the opposite nostril - Manages bleeding with ice and nasal packing - Has not used Afrin nasal spray - Has not taken NSAIDs and has only taken one aspirin  Respiratory symptoms - Cough present secondary to PND   Physical Exam   Physical Exam Vitals reviewed.  Constitutional:      General: She is not in acute distress.    Appearance: Normal appearance. She is not ill-appearing.  HENT:     Head: Normocephalic and atraumatic.  Cardiovascular:     Rate and Rhythm: Normal rate and regular rhythm.     Pulses: Normal pulses.     Heart sounds: Normal heart sounds. No murmur heard.    No friction rub. No gallop.  Pulmonary:     Effort: Pulmonary effort is normal. No respiratory distress.     Breath sounds: Normal breath sounds. No wheezing.  Skin:    General: Skin is warm and dry.  Neurological:     Mental Status: She is alert and oriented to person, place, and time.  Psychiatric:        Mood and Affect: Mood normal.        Behavior: Behavior normal.        Thought Content: Thought content normal.        Judgment: Judgment normal.    Assessment & Plan      Assessment and Plan    Acute non-recurrent left maxillary sinusitis Pain and burning in left maxillary sinus post-epistaxis. Concern for blood buildup and infection. Nasal drainage present. Previous antihistamine and saline spray use. Discussed Aquaphor and Afrin for management. Prescribed Z-Pak for infection. - Prescribed Z-Pak (azithromycin ) for 5 days: two tablets initially, then one daily. - Advised Aquaphor on Q-tip for nasal moisturization twice daily. - Continue saline sprays as needed. - Discussed Afrin nasal spray for acute epistaxis, cautioning against prolonged use.  Recurrent epistaxis Recent severe episode likely due to dry nasal passages and environmental factors. Persistent dryness noted. Discussed Aquaphor and Afrin for management.  - Advised Aquaphor on Q-tip for nasal moisturization twice daily. - Discussed Afrin nasal spray for acute epistaxis, cautioning against prolonged use. - Advised against Vicks for nasal passages. - Discussed environmental modifications: use humidifier, consider alternative heating.     Follow up   Return if symptoms worsen or fail to improve. __________________________________ Zada FREDRIK Palin, DNP, APRN, FNP-BC [facility]

## 2024-08-14 ENCOUNTER — Telehealth: Payer: Self-pay

## 2024-08-14 NOTE — Telephone Encounter (Signed)
 Patient informed. States she is going to complete her medication before going back to swimming.

## 2024-08-14 NOTE — Telephone Encounter (Signed)
 Copied from CRM [id number]. Topic: Clinical - Medical Advice >> Aug 14, 2024  2:55 PM Fonda T wrote: Reason for CRM: Patient stets she was seen recently in office and treated for sinus infection.  Patient would like to know how long she needs to stay out of the pool as she was going to the local [employer] to swim frequently.   Patient is requesting a return call to advise further.    Can be reached at [phone].

## 2024-10-01 ENCOUNTER — Encounter: Payer: Self-pay | Admitting: Family Medicine

## 2024-10-01 ENCOUNTER — Ambulatory Visit: Admitting: Family Medicine

## 2024-10-01 VITALS — BP 124/68 | HR 100 | Temp 98.0°F | Resp 16 | Ht 64.0 in | Wt 181.6 lb

## 2024-10-01 DIAGNOSIS — J40 Bronchitis, not specified as acute or chronic: Secondary | ICD-10-CM | POA: Diagnosis not present

## 2024-10-01 MED ORDER — METHYLPREDNISOLONE ACETATE 80 MG/ML IJ SUSP
80.0000 mg | Freq: Once | INTRAMUSCULAR | Status: AC
  Administered 2024-10-01: 80 mg via INTRAMUSCULAR

## 2024-10-01 MED ORDER — AZITHROMYCIN 250 MG PO TABS: ORAL_TABLET | ORAL | 0 refills | Status: DC

## 2024-10-01 MED ORDER — PROMETHAZINE-DM 6.25-15 MG/5ML PO SYRP: 5.0000 mL | ORAL_SOLUTION | Freq: Four times a day (QID) | ORAL | 0 refills | Status: DC | PRN

## 2024-10-01 NOTE — Progress Notes (Signed)
 Chief Complaint  Patient presents with   Cough    Cough     Amanda Walters here for URI complaints.  Duration: 5 days  Associated symptoms: sinus congestion, sinus pain, rhinorrhea, ear pain, sore throat, wheezing, fever 102, coughing, fatigue Denies: sinus pain, itchy watery eyes, ear drainage, shortness of breath, myalgia, and dental pain Treatment to date: Tessalon  Perles, Tylenol , ibuprofen Sick contacts: No Tested neg for flu and covid.   Past Medical History:  Diagnosis Date   COVID-19 06/05/2020   Fever due to COVID-19 06/05/2020   GERD (gastroesophageal reflux disease)     Objective BP 124/68 (BP Location: Left Arm, Patient Position: Sitting)   Pulse 100   Temp 98 F (36.7 C) (Oral)   Resp 16   Ht 5' 4 (1.626 m)   Wt 181 lb 9.6 oz (82.4 kg)   SpO2 95%   BMI 31.17 kg/m  General: Awake, alert, appears stated age HEENT: AT, , ears patent b/l and TM's neg, nares patent w/o discharge, pharynx pink and without exudates, MMM, no sinus ttp Neck: No masses or asymmetry Heart: RRR Lungs: Faint expiratory wheezes at the bases, no accessory muscle use Psych: Age appropriate judgment and insight, normal mood and affect  Wheezy bronchitis - Plan: promethazine -dextromethorphan (PROMETHAZINE -DM) 6.25-15 MG/5ML syrup, azithromycin  (ZITHROMAX ) 250 MG tablet  Depo-Medrol  injection 80 mg IM today.  If no improvement the next 2 days, she will take a Z-Pak.  Cough syrup as above.  Warnings about drowsiness verbalized and written down.  Continue to push fluids, practice good hand hygiene, cover mouth when coughing. F/u prn. If starting to experience worsening symptoms, shaking, or shortness of breath, seek immediate care. Pt voiced understanding and agreement to the plan.  [name], DO 10/01/2024 11:00 AM

## 2024-10-01 NOTE — Addendum Note (Signed)
 Addended by: Celestine Bougie M on: 10/01/2024 11:36 AM   Modules accepted: Orders

## 2024-10-01 NOTE — Patient Instructions (Signed)
 Continue to push fluids, practice good hand hygiene, and cover your mouth if you cough.  If you start having fevers, shaking or shortness of breath, seek immediate care.  OK to take Tylenol  1000 mg (2 extra strength tabs) or 975 mg (3 regular strength tabs) every 6 hours as needed.  See how you do over the next 2 days. If not obviously better on Xmas, take the azithromycin .   Do not drink alcohol, do any illicit/street drugs, drive or do anything that requires alertness while on this cough medicine.   Let us  know if you need anything.

## 2024-10-22 ENCOUNTER — Encounter: Payer: Self-pay | Admitting: Cardiovascular Disease

## 2024-10-22 ENCOUNTER — Ambulatory Visit: Attending: Cardiovascular Disease | Admitting: Cardiovascular Disease

## 2024-10-22 VITALS — BP 116/72 | HR 74 | Ht 64.5 in | Wt 171.0 lb

## 2024-10-22 DIAGNOSIS — R0789 Other chest pain: Secondary | ICD-10-CM | POA: Diagnosis not present

## 2024-10-22 DIAGNOSIS — Z8249 Family history of ischemic heart disease and other diseases of the circulatory system: Secondary | ICD-10-CM | POA: Diagnosis not present

## 2024-10-22 NOTE — Progress Notes (Signed)
 "     10/22/2024 Amanda Walters   [birth year]  [id number]  Primary Physician Amanda Mini, NP Primary Cardiologist: Amanda Amanda Lesches MD [facility], [place], [place]  HPI:  Amanda Walters is a 69 y.o.  Mild to moderately overweight single Caucasian female with no children was referred by Dr. Alvan, her PCP, for fatigue.  I last saw her in the office 08/09/2022.  She did have 2 dogs but unfortunately had she had to put 1 down recently. She is a retired engineer, civil (consulting) having stopped teaching 7 years ago. She otherwise has no cardiac risk factors other than a mother who died age 31 of myocardial infarction. She has had bilateral total knee replacements. She is completely asymptomatic other than from fatigue which she has had for the last several months. She did have COVID back in 2021 but complete recovered from that.  She did have a 2D echo performed 09/26/2022 that revealed normal LV systolic function, grade 1 diastolic dysfunction with no valvular abnormalities.  A coronary calcium score performed 09/26/2022 was 0.  She denies chest pain or shortness of breath but does complain of some pain between her scapula.  Active Medications[1]   Allergies[2]  Social History   Socioeconomic History   Marital status: Single    Spouse name: Not on file   Number of children: 0   Years of education: 29   Highest education level: Master's degree (e.g., MA, MS, MEng, MEd, MSW, MBA)  Occupational History   Occupation: Nurse, Learning Disability    Comment: Retired  Tobacco Use   Smoking status: Never   Smokeless tobacco: Never  Vaping Use   Vaping status: Never Used  Substance and Sexual Activity   Alcohol use: Not Currently   Drug use: Never   Sexual activity: Not Currently  Other Topics Concern   Not on file  Social History Narrative   Lives alone. She enjoys yardwork, croteching, baking and reading.    Social Drivers of Health   Tobacco Use: Low Risk (10/22/2024)   Patient History     Smoking Tobacco Use: Never    Smokeless Tobacco Use: Never    Passive Exposure: Not on file  Financial Resource Strain: Low Risk (03/28/2024)   Overall Financial Resource Strain (CARDIA)    Difficulty of Paying Living Expenses: Not hard at all  Food Insecurity: No Food Insecurity (03/28/2024)   Epic    Worried About Programme Researcher, Broadcasting/film/video in the Last Year: Never true    Ran Out of Food in the Last Year: Never true  Transportation Needs: No Transportation Needs (03/28/2024)   Epic    Lack of Transportation (Medical): No    Lack of Transportation (Non-Medical): No  Physical Activity: Sufficiently Active (03/28/2024)   Exercise Vital Sign    Days of Exercise per Week: 3 days    Minutes of Exercise per Session: 60 min  Stress: No Stress Concern Present (03/28/2024)   [employer] of Occupational Health - Occupational Stress Questionnaire    Feeling of Stress: Not at all  Social Connections: Socially Integrated (03/28/2024)   Social Connection and Isolation Panel    Frequency of Communication with Friends and Family: More than three times a week    Frequency of Social Gatherings with Friends and Family: More than three times a week    Attends Religious Services: More than 4 times per year    Active Member of [employer] or Organizations: Yes    Attends Club or  Organization Meetings: More than 4 times per year    Marital Status: Married  Catering Manager Violence: Not At Risk (03/28/2024)   Epic    Fear of Current or Ex-Partner: No    Emotionally Abused: No    Physically Abused: No    Sexually Abused: No  Depression (PHQ2-9): Low Risk (03/28/2024)   Depression (PHQ2-9)    PHQ-2 Score: 0  Alcohol Screen: Low Risk (03/28/2024)   Alcohol Screen    Last Alcohol Screening Score (AUDIT): 0  Housing: Low Risk (03/28/2024)   Epic    Unable to Pay for Housing in the Last Year: No    Number of Times Moved in the Last Year: 0    Homeless in the Last Year: No  Utilities: Not At Risk (03/28/2024)   Epic     Threatened with loss of utilities: No  Health Literacy: Adequate Health Literacy (03/28/2024)   B1300 Health Literacy    Frequency of need for help with medical instructions: Never     Review of Systems: General: negative for chills, fever, night sweats or weight changes.  Cardiovascular: negative for chest pain, dyspnea on exertion, edema, orthopnea, palpitations, paroxysmal nocturnal dyspnea or shortness of breath Dermatological: negative for rash Respiratory: negative for cough or wheezing Urologic: negative for hematuria Abdominal: negative for nausea, vomiting, diarrhea, bright red blood per rectum, melena, or hematemesis Neurologic: negative for visual changes, syncope, or dizziness All other systems reviewed and are otherwise negative except as noted above.    Blood pressure 116/72, pulse 74, height 5' 4.5 (1.638 m), weight 171 lb (77.6 kg), SpO2 97%.  General appearance: alert and no distress Neck: no adenopathy, no carotid bruit, no JVD, supple, symmetrical, trachea midline, and thyroid  not enlarged, symmetric, no tenderness/mass/nodules Lungs: clear to auscultation bilaterally Heart: regular rate and rhythm, S1, S2 normal, no murmur, click, rub or gallop Extremities: extremities normal, atraumatic, no cyanosis or edema Pulses: 2+ and symmetric Skin: Skin color, texture, turgor normal. No rashes or lesions Neurologic: Grossly normal  EKG EKG Interpretation Date/Time:  Tuesday October 22 2024 14:18:27 EST Ventricular Rate:  74 PR Interval:  170 QRS Duration:  80 QT Interval:  390 QTC Calculation: 432 R Axis:   -33  Text Interpretation: Normal sinus rhythm Possible Left atrial enlargement Left axis deviation No previous ECGs available Confirmed by Walters Carrier [phone]) on 10/22/2024 2:20:16 PM    ASSESSMENT AND PLAN:   Family history of heart disease Mother died of a heart attack at age 49.     Carrier Amanda Court MD FACP,FACC,FAHA, FSCAI 10/22/2024 2:25  PM    [1]  Current Meds  Medication Sig   ascorbic acid (VITAMIN C) 500 MG tablet Vitamin C 500 mg tablet   2 tablets every day by oral route.   Biotin 1000 MCG tablet Take 1,000 mcg by mouth daily.   Calcium Citrate (CITRACAL PO) Take 2 tablets by mouth daily.   Cholecalciferol (VITAMIN D3) 125 MCG (5000 UT) CAPS Take 5,000 Units by mouth daily.   estradiol (ESTRACE) 1 MG tablet 1.5 mg daily in the afternoon.   gabapentin  (NEURONTIN ) 100 MG capsule Take 1 capsule (100 mg total) by mouth 3 (three) times daily.   MAGNESIUM PO Take 250 mg by mouth daily.   methocarbamol (ROBAXIN) 500 MG tablet Take 1 tablet by mouth every 8 (eight) hours. (Patient taking differently: Take 1 tablet by mouth as needed for muscle spasms.)   Multiple Vitamins-Minerals (MULTIVITAMIN PO) Take 2 tablets by mouth daily.  One a day pedit   Multiple Vitamins-Minerals (ZINC PO) Take 50 mg by mouth daily.   omeprazole (PRILOSEC) 20 MG capsule Take 20 mg by mouth daily.   vitamin B-12 (CYANOCOBALAMIN ) 1000 MCG tablet Take 1,500 mcg by mouth daily.  [2]  Allergies Allergen Reactions   Misc. Sulfonamide Containing Compounds Hives   Morphine And Codeine Itching   Sulfa Antibiotics Hives   Morphine Hives   Cephalexin Other (See Comments)    funny feeling   "

## 2024-10-22 NOTE — Patient Instructions (Signed)
 Medication Instructions:  Your physician recommends that you continue on your current medications as directed. Please refer to the Current Medication list given to you today.  *If you need a refill on your cardiac medications before your next appointment, please call your pharmacy*   Follow-Up: At [facility], you and your health needs are our priority.  As part of our continuing mission to provide you with exceptional heart care, our providers are all part of one team.  This team includes your primary Cardiologist (physician) and Advanced Practice Providers or APPs (Physician Assistants and Nurse Practitioners) who all work together to provide you with the care you need, when you need it.  Your next appointment:   We will see you on an as needed basis.   Provider:   Dorn Lesches, MD     We recommend signing up for the patient portal called MyChart.  Sign up information is provided on this After Visit Summary.  MyChart is used to connect with patients for Virtual Visits (Telemedicine).  Patients are able to view lab/test results, encounter notes, upcoming appointments, etc.  Non-urgent messages can be sent to your provider as well.   To learn more about what you can do with MyChart, go to [email or web address].   Other Instructions

## 2024-10-22 NOTE — Assessment & Plan Note (Signed)
 Mother died of a heart attack at age 70.

## 2024-10-28 ENCOUNTER — Ambulatory Visit (INDEPENDENT_AMBULATORY_CARE_PROVIDER_SITE_OTHER): Admitting: Physician Assistant

## 2024-10-28 ENCOUNTER — Encounter: Payer: Self-pay | Admitting: Physician Assistant

## 2024-10-28 VITALS — BP 131/52 | HR 66 | Temp 98.8°F | Ht 64.0 in | Wt 177.0 lb

## 2024-10-28 DIAGNOSIS — H6991 Unspecified Eustachian tube disorder, right ear: Secondary | ICD-10-CM

## 2024-10-28 DIAGNOSIS — R058 Other specified cough: Secondary | ICD-10-CM | POA: Diagnosis not present

## 2024-10-28 MED ORDER — METHYLPREDNISOLONE 4 MG PO TBPK: ORAL_TABLET | ORAL | 0 refills | Status: AC

## 2024-10-28 NOTE — Patient Instructions (Signed)
 Eustachian Tube Dysfunction  Eustachian tube dysfunction refers to a condition in which a blockage develops in the narrow passage that connects the middle ear to the back of the nose (eustachian tube). The eustachian tube regulates air pressure in the middle ear by letting air move between the ear and nose. It also helps to drain fluid from the middle ear space. Eustachian tube dysfunction can affect one or both ears. When the eustachian tube does not function properly, air pressure, fluid, or both can build up in the middle ear. What are the causes? This condition occurs when the eustachian tube becomes blocked or cannot open normally. Common causes of this condition include: Ear infections. Colds and other infections that affect the nose, mouth, and throat (upper respiratory tract). Allergies. Irritation from cigarette smoke. Irritation from stomach acid coming up into the esophagus (gastroesophageal reflux). The esophagus is the part of the body that moves food from the mouth to the stomach. Sudden changes in air pressure, such as from descending in an airplane or scuba diving. Abnormal growths in the nose or throat, such as: Growths that line the nose (nasal polyps). Abnormal growth of cells (tumors). Enlarged tissue at the back of the throat (adenoids). What increases the risk? You are more likely to develop this condition if: You smoke. You are overweight. You are a child who has: Certain birth defects of the mouth, such as cleft palate. Large tonsils or adenoids. What are the signs or symptoms? Common symptoms of this condition include: A feeling of fullness in the ear. Ear pain. Clicking or popping noises in the ear. Ringing in the ear (tinnitus). Hearing loss. Loss of balance. Dizziness. Symptoms may get worse when the air pressure around you changes, such as when you travel to an area of high elevation, fly on an airplane, or go scuba diving. How is this diagnosed? This  condition may be diagnosed based on: Your symptoms. A physical exam of your ears, nose, and throat. Tests, such as those that measure: The movement of your eardrum. Your hearing (audiometry). How is this treated? Treatment depends on the cause and severity of your condition. In mild cases, you may relieve your symptoms by moving air into your ears. This is called "popping the ears." In more severe cases, or if you have symptoms of fluid in your ears, treatment may include: Medicines to relieve congestion (decongestants). Medicines that treat allergies (antihistamines). Nasal sprays or ear drops that contain medicines that reduce swelling (steroids). A procedure to drain the fluid in your eardrum. In this procedure, a small tube may be placed in the eardrum to: Drain the fluid. Restore the air in the middle ear space. A procedure to insert a balloon device through the nose to inflate the opening of the eustachian tube (balloon dilation). Follow these instructions at home: Lifestyle Do not do any of the following until your health care provider approves: Travel to high altitudes. Fly in airplanes. Work in a Estate agent or room. Scuba dive. Do not use any products that contain nicotine or tobacco. These products include cigarettes, chewing tobacco, and vaping devices, such as e-cigarettes. If you need help quitting, ask your health care provider. Keep your ears dry. Wear fitted earplugs during showering and bathing. Dry your ears completely after. General instructions Take over-the-counter and prescription medicines only as told by your health care provider. Use techniques to help pop your ears as recommended by your health care provider. These may include: Chewing gum. Yawning. Frequent, forceful swallowing.  Closing your mouth, holding your nose closed, and gently blowing as if you are trying to blow air out of your nose. Keep all follow-up visits. This is important. Contact a  health care provider if: Your symptoms do not go away after treatment. Your symptoms come back after treatment. You are unable to pop your ears. You have: A fever. Pain in your ear. Pain in your head or neck. Fluid draining from your ear. Your hearing suddenly changes. You become very dizzy. You lose your balance. Get help right away if: You have a sudden, severe increase in any of your symptoms. Summary Eustachian tube dysfunction refers to a condition in which a blockage develops in the eustachian tube. It can be caused by ear infections, allergies, inhaled irritants, or abnormal growths in the nose or throat. Symptoms may include ear pain or fullness, hearing loss, or ringing in the ears. Mild cases are treated with techniques to unblock the ears, such as yawning or chewing gum. More severe cases are treated with medicines or procedures. This information is not intended to replace advice given to you by your health care provider. Make sure you discuss any questions you have with your health care provider. Document Revised: 12/07/2020 Document Reviewed: 12/07/2020 Elsevier Patient Education  2024 [employer].

## 2024-10-28 NOTE — Progress Notes (Signed)
 "  Acute Office Visit  Subjective:     Patient ID: Amanda Walters, female    DOB: [birth year], 69 y.o.   MRN: [id number]   HPI .Discussed the use of AI scribe software for clinical note transcription with the patient, who gave verbal consent to proceed.  History of Present Illness Amanda Walters is a 70 year old female who presents with right ear fullness and sensation of fluid behind the tympanic membrane.  Ear fullness and sensation of fluid - Persistent fullness primarily in the right ear since October 01, 2024, following treatment for bronchitis with azithromycin  and promethazine , dextromethorphan syrup - Sensation of fluid shifting in the ear when yawning - Fullness less severe than initially, possibly during sleep - No pain with palpation of the ear - No significant hearing loss beyond baseline gradual decline - History of prior clogged ear requiring treatment  Cough and respiratory symptoms - Cough remains present, described as 'about half and half' productive and dry - Difficulty expectorating mucus - Fever was present at onset of illness but has resolved - Congestion has improved since initial treatment  Epistaxis management - Epistaxis managed with nasal spray for one month - Epistaxis has ceased    ROS See HPI.     Objective:    BP (!) 131/52   Pulse 66   Temp 98.8 F (37.1 C) (Oral)   Ht 5' 4 (1.626 m)   Wt 177 lb (80.3 kg)   SpO2 99%   BMI 30.38 kg/m  BP Readings from Last 3 Encounters:  10/28/24 (!) 131/52  10/22/24 116/72  10/01/24 124/68   Wt Readings from Last 3 Encounters:  10/28/24 177 lb (80.3 kg)  10/22/24 171 lb (77.6 kg)  10/01/24 181 lb 9.6 oz (82.4 kg)      Physical Exam Constitutional:      Appearance: Normal appearance.  HENT:     Head: Normocephalic.     Right Ear: Ear canal and external ear normal. There is no impacted cerumen.     Left Ear: Tympanic membrane, ear canal and external ear normal. There is no impacted  cerumen.     Ears:     Comments: TM opague from 6oclock to 9oclock. Good light reflex. No erythema.     Nose: Nose normal.     Mouth/Throat:     Mouth: Mucous membranes are moist.  Eyes:     Conjunctiva/sclera: Conjunctivae normal.  Cardiovascular:     Rate and Rhythm: Normal rate and regular rhythm.  Pulmonary:     Effort: Pulmonary effort is normal.     Breath sounds: Normal breath sounds.  Musculoskeletal:     Cervical back: No tenderness.  Lymphadenopathy:     Cervical: No cervical adenopathy.  Neurological:     General: No focal deficit present.     Mental Status: She is alert and oriented to person, place, and time.  Psychiatric:        Mood and Affect: Mood normal.         Assessment & Plan:  SABRASABRAMalika was seen today for ear pain.  Diagnoses and all orders for this visit:  Dysfunction of right eustachian tube -     methylPREDNISolone  (MEDROL  DOSEPAK) 4 MG TBPK tablet; Take as directed by package insert.  Post-viral cough syndrome -     methylPREDNISolone  (MEDROL  DOSEPAK) 4 MG TBPK tablet; Take as directed by package insert.   Assessment & Plan Eustachian tube dysfunction with effusion Right ear fullness and fluid  sensation post-bronchitis. Likely post-viral inflammation causing Eustachian tube dysfunction. Nasal spray beneficial for Eustachian tube function. Steroid treatment expected to reduce inflammation and facilitate drainage. - Prescribed Medrol  Dose Pack to reduce inflammation and facilitate drainage. - Provided handout with tips for Eustachian tube function, including techniques like sucking through a straw and chewing gum. - Advised follow-up with ENT if symptoms persist.  Post-infectious cough Persistent cough following bronchitis, partially productive. Lungs clear on examination. Steroid treatment may aid in resolving post-viral cough. - Prescribed Medrol  Dose Pack to help with post-viral cough.     Return if symptoms worsen or fail to  improve.  Jeremi Losito, PA-C   "

## 2024-11-13 ENCOUNTER — Telehealth: Payer: Self-pay | Admitting: Medical-Surgical

## 2024-11-13 ENCOUNTER — Ambulatory Visit

## 2024-11-13 DIAGNOSIS — Z780 Asymptomatic menopausal state: Secondary | ICD-10-CM

## 2024-11-13 DIAGNOSIS — Z7983 Long term (current) use of bisphosphonates: Secondary | ICD-10-CM

## 2024-11-13 DIAGNOSIS — G629 Polyneuropathy, unspecified: Secondary | ICD-10-CM

## 2024-11-13 DIAGNOSIS — Z7952 Long term (current) use of systemic steroids: Secondary | ICD-10-CM | POA: Diagnosis not present

## 2024-11-13 NOTE — Telephone Encounter (Signed)
 Pt wants a referral to neuro for numbness in her feet

## 2024-11-13 NOTE — Telephone Encounter (Signed)
 Referral placed

## 2024-11-19 ENCOUNTER — Ambulatory Visit: Admitting: Medical-Surgical

## 2025-04-01 ENCOUNTER — Ambulatory Visit

## 2025-07-21 ENCOUNTER — Encounter: Admitting: Medical-Surgical
